# Patient Record
Sex: Female | Born: 1985 | Marital: Married | Smoking: Former smoker
Health system: Southern US, Community
[De-identification: ages and names within clinical notes are randomized; demographics above are authoritative.]

## PROBLEM LIST (undated history)

## (undated) DIAGNOSIS — I609 Nontraumatic subarachnoid hemorrhage, unspecified: Secondary | ICD-10-CM

## (undated) DIAGNOSIS — F419 Anxiety disorder, unspecified: Secondary | ICD-10-CM

## (undated) DIAGNOSIS — I671 Cerebral aneurysm, nonruptured: Secondary | ICD-10-CM

## (undated) DIAGNOSIS — F32A Depression, unspecified: Secondary | ICD-10-CM

## (undated) HISTORY — DX: Cerebral aneurysm, nonruptured: I67.1

---
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
## 2003-01-09 ENCOUNTER — Ambulatory Visit (HOSPITAL_COMMUNITY): Admission: RE | Admit: 2003-01-09 | Discharge: 2003-01-09 | Payer: Self-pay | Admitting: Obstetrics and Gynecology

## 2003-06-14 ENCOUNTER — Ambulatory Visit (HOSPITAL_COMMUNITY): Admission: AD | Admit: 2003-06-14 | Discharge: 2003-06-14 | Payer: Self-pay | Admitting: Obstetrics and Gynecology

## 2003-07-24 ENCOUNTER — Inpatient Hospital Stay (HOSPITAL_COMMUNITY): Admission: AD | Admit: 2003-07-24 | Discharge: 2003-07-27 | Payer: Self-pay | Admitting: Obstetrics and Gynecology

## 2004-01-08 ENCOUNTER — Ambulatory Visit (HOSPITAL_COMMUNITY): Admission: RE | Admit: 2004-01-08 | Discharge: 2004-01-08 | Payer: Self-pay | Admitting: *Deleted

## 2004-01-11 ENCOUNTER — Ambulatory Visit (HOSPITAL_COMMUNITY): Admission: RE | Admit: 2004-01-11 | Discharge: 2004-01-11 | Payer: Self-pay | Admitting: *Deleted

## 2004-05-06 ENCOUNTER — Ambulatory Visit: Payer: Self-pay | Admitting: Family Medicine

## 2004-09-02 ENCOUNTER — Ambulatory Visit: Payer: Self-pay | Admitting: Family Medicine

## 2004-09-08 ENCOUNTER — Ambulatory Visit (HOSPITAL_COMMUNITY): Admission: RE | Admit: 2004-09-08 | Discharge: 2004-09-08 | Payer: Self-pay | Admitting: Family Medicine

## 2004-10-10 ENCOUNTER — Ambulatory Visit: Payer: Self-pay | Admitting: Family Medicine

## 2004-10-15 ENCOUNTER — Ambulatory Visit: Payer: Self-pay | Admitting: Family Medicine

## 2004-10-20 ENCOUNTER — Ambulatory Visit: Payer: Self-pay | Admitting: Family Medicine

## 2004-11-27 ENCOUNTER — Ambulatory Visit: Payer: Self-pay | Admitting: Family Medicine

## 2004-12-29 ENCOUNTER — Ambulatory Visit: Payer: Self-pay | Admitting: Family Medicine

## 2005-01-05 ENCOUNTER — Ambulatory Visit: Payer: Self-pay | Admitting: Family Medicine

## 2005-02-11 ENCOUNTER — Ambulatory Visit: Payer: Self-pay | Admitting: Family Medicine

## 2005-02-24 ENCOUNTER — Ambulatory Visit: Payer: Self-pay | Admitting: Family Medicine

## 2005-03-19 ENCOUNTER — Ambulatory Visit: Payer: Self-pay | Admitting: Family Medicine

## 2005-05-13 ENCOUNTER — Ambulatory Visit: Payer: Self-pay | Admitting: Family Medicine

## 2005-05-26 ENCOUNTER — Ambulatory Visit (HOSPITAL_COMMUNITY): Admission: RE | Admit: 2005-05-26 | Discharge: 2005-05-26 | Payer: Self-pay | Admitting: Family Medicine

## 2005-05-26 ENCOUNTER — Ambulatory Visit: Payer: Self-pay | Admitting: Family Medicine

## 2005-06-18 ENCOUNTER — Ambulatory Visit: Payer: Self-pay | Admitting: Family Medicine

## 2005-07-16 ENCOUNTER — Ambulatory Visit: Payer: Self-pay | Admitting: Family Medicine

## 2005-07-21 ENCOUNTER — Ambulatory Visit: Payer: Self-pay | Admitting: Family Medicine

## 2005-08-03 ENCOUNTER — Ambulatory Visit: Payer: Self-pay | Admitting: Family Medicine

## 2005-09-22 ENCOUNTER — Ambulatory Visit: Payer: Self-pay | Admitting: Family Medicine

## 2005-09-23 ENCOUNTER — Ambulatory Visit (HOSPITAL_COMMUNITY): Admission: RE | Admit: 2005-09-23 | Discharge: 2005-09-23 | Payer: Self-pay | Admitting: Family Medicine

## 2006-01-26 ENCOUNTER — Ambulatory Visit: Payer: Self-pay | Admitting: Family Medicine

## 2006-02-15 ENCOUNTER — Ambulatory Visit: Payer: Self-pay | Admitting: Orthopedic Surgery

## 2006-02-22 ENCOUNTER — Encounter (HOSPITAL_COMMUNITY): Admission: RE | Admit: 2006-02-22 | Discharge: 2006-03-06 | Payer: Self-pay | Admitting: Orthopedic Surgery

## 2006-07-01 ENCOUNTER — Ambulatory Visit: Payer: Self-pay | Admitting: Family Medicine

## 2006-07-01 ENCOUNTER — Other Ambulatory Visit: Admission: RE | Admit: 2006-07-01 | Discharge: 2006-07-01 | Payer: Self-pay | Admitting: Family Medicine

## 2006-07-01 ENCOUNTER — Encounter: Payer: Self-pay | Admitting: Family Medicine

## 2006-07-02 ENCOUNTER — Encounter: Payer: Self-pay | Admitting: Family Medicine

## 2006-07-02 LAB — CONVERTED CEMR LAB: Candida species: NEGATIVE

## 2006-07-15 ENCOUNTER — Ambulatory Visit: Payer: Self-pay | Admitting: Family Medicine

## 2006-07-28 ENCOUNTER — Ambulatory Visit: Payer: Self-pay | Admitting: Family Medicine

## 2006-07-28 ENCOUNTER — Ambulatory Visit (HOSPITAL_COMMUNITY): Admission: RE | Admit: 2006-07-28 | Discharge: 2006-07-28 | Payer: Self-pay | Admitting: Family Medicine

## 2006-07-29 ENCOUNTER — Encounter: Payer: Self-pay | Admitting: Family Medicine

## 2006-10-29 ENCOUNTER — Ambulatory Visit: Payer: Self-pay | Admitting: Family Medicine

## 2007-02-01 ENCOUNTER — Ambulatory Visit: Payer: Self-pay | Admitting: Family Medicine

## 2007-02-25 ENCOUNTER — Ambulatory Visit: Payer: Self-pay | Admitting: Family Medicine

## 2007-05-09 ENCOUNTER — Ambulatory Visit: Payer: Self-pay | Admitting: Family Medicine

## 2007-05-09 ENCOUNTER — Ambulatory Visit (HOSPITAL_COMMUNITY): Admission: RE | Admit: 2007-05-09 | Discharge: 2007-05-09 | Payer: Self-pay | Admitting: Family Medicine

## 2007-05-10 ENCOUNTER — Encounter: Payer: Self-pay | Admitting: Family Medicine

## 2007-06-09 ENCOUNTER — Encounter: Payer: Self-pay | Admitting: Family Medicine

## 2007-06-22 ENCOUNTER — Other Ambulatory Visit: Admission: RE | Admit: 2007-06-22 | Discharge: 2007-06-22 | Payer: Self-pay | Admitting: Obstetrics & Gynecology

## 2007-11-09 ENCOUNTER — Ambulatory Visit: Payer: Self-pay | Admitting: Obstetrics & Gynecology

## 2007-11-09 ENCOUNTER — Inpatient Hospital Stay (HOSPITAL_COMMUNITY): Admission: AD | Admit: 2007-11-09 | Discharge: 2007-11-14 | Payer: Self-pay | Admitting: Gynecology

## 2007-11-24 ENCOUNTER — Ambulatory Visit (HOSPITAL_COMMUNITY): Admission: RE | Admit: 2007-11-24 | Discharge: 2007-11-24 | Payer: Self-pay | Admitting: Obstetrics and Gynecology

## 2007-12-05 ENCOUNTER — Inpatient Hospital Stay (HOSPITAL_COMMUNITY): Admission: AD | Admit: 2007-12-05 | Discharge: 2007-12-09 | Payer: Self-pay | Admitting: Obstetrics & Gynecology

## 2007-12-05 ENCOUNTER — Encounter: Payer: Self-pay | Admitting: Obstetrics & Gynecology

## 2007-12-05 ENCOUNTER — Ambulatory Visit: Payer: Self-pay | Admitting: Obstetrics & Gynecology

## 2007-12-05 HISTORY — PX: TUBAL LIGATION: SHX77

## 2008-09-16 ENCOUNTER — Encounter: Payer: Self-pay | Admitting: Family Medicine

## 2008-09-17 ENCOUNTER — Encounter: Payer: Self-pay | Admitting: Family Medicine

## 2008-09-17 ENCOUNTER — Telehealth: Payer: Self-pay | Admitting: Family Medicine

## 2008-10-16 ENCOUNTER — Ambulatory Visit: Payer: Self-pay | Admitting: Family Medicine

## 2008-10-16 DIAGNOSIS — F329 Major depressive disorder, single episode, unspecified: Secondary | ICD-10-CM

## 2008-10-16 DIAGNOSIS — M25519 Pain in unspecified shoulder: Secondary | ICD-10-CM

## 2008-10-16 DIAGNOSIS — J301 Allergic rhinitis due to pollen: Secondary | ICD-10-CM | POA: Insufficient documentation

## 2008-10-16 DIAGNOSIS — N946 Dysmenorrhea, unspecified: Secondary | ICD-10-CM

## 2008-10-16 DIAGNOSIS — F4321 Adjustment disorder with depressed mood: Secondary | ICD-10-CM

## 2008-10-16 DIAGNOSIS — N920 Excessive and frequent menstruation with regular cycle: Secondary | ICD-10-CM

## 2008-10-16 DIAGNOSIS — R55 Syncope and collapse: Secondary | ICD-10-CM | POA: Insufficient documentation

## 2008-10-16 DIAGNOSIS — F3289 Other specified depressive episodes: Secondary | ICD-10-CM | POA: Insufficient documentation

## 2008-10-16 DIAGNOSIS — D649 Anemia, unspecified: Secondary | ICD-10-CM

## 2008-10-18 DIAGNOSIS — G4700 Insomnia, unspecified: Secondary | ICD-10-CM | POA: Insufficient documentation

## 2008-10-18 DIAGNOSIS — L0100 Impetigo, unspecified: Secondary | ICD-10-CM | POA: Insufficient documentation

## 2008-10-18 DIAGNOSIS — N3000 Acute cystitis without hematuria: Secondary | ICD-10-CM

## 2008-11-20 ENCOUNTER — Encounter: Payer: Self-pay | Admitting: Family Medicine

## 2008-11-20 ENCOUNTER — Other Ambulatory Visit: Admission: RE | Admit: 2008-11-20 | Discharge: 2008-11-20 | Payer: Self-pay | Admitting: Family Medicine

## 2008-11-20 ENCOUNTER — Ambulatory Visit: Payer: Self-pay | Admitting: Family Medicine

## 2008-11-20 DIAGNOSIS — L708 Other acne: Secondary | ICD-10-CM

## 2008-11-20 DIAGNOSIS — N771 Vaginitis, vulvitis and vulvovaginitis in diseases classified elsewhere: Secondary | ICD-10-CM

## 2008-11-21 ENCOUNTER — Encounter: Payer: Self-pay | Admitting: Family Medicine

## 2008-11-21 LAB — CONVERTED CEMR LAB
Candida species: NEGATIVE
Chlamydia, DNA Probe: NEGATIVE
GC Probe Amp, Genital: NEGATIVE
Gardnerella vaginalis: NEGATIVE

## 2008-11-26 ENCOUNTER — Telehealth: Payer: Self-pay | Admitting: Family Medicine

## 2009-01-29 ENCOUNTER — Telehealth: Payer: Self-pay | Admitting: Family Medicine

## 2009-02-27 ENCOUNTER — Ambulatory Visit (HOSPITAL_COMMUNITY): Admission: RE | Admit: 2009-02-27 | Discharge: 2009-02-27 | Payer: Self-pay | Admitting: Obstetrics and Gynecology

## 2009-04-03 IMAGING — US US FETAL BPP W/O NONSTRESS
1 series · 14 of 28 positions shown · non-contrast
Comparison: none

OBSTETRICAL ULTRASOUND:

 This ultrasound exam was performed in the [HOSPITAL] Ultrasound Department.  The OB US report was generated in the AS system, and faxed to the ordering physician.  This report is also available in [REDACTED] PACS.

[Series 1: us fetal bpp w/o nonstress · 58 acquisitions, 14 frames shown]
[im 3/58]
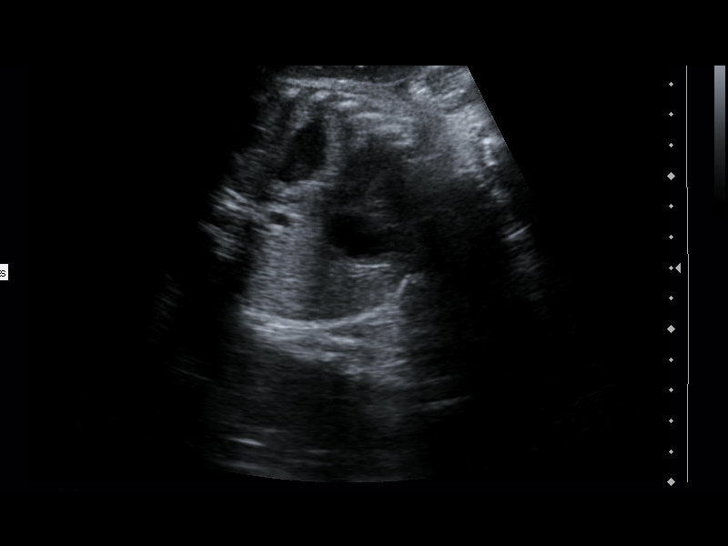
[im 7/58]
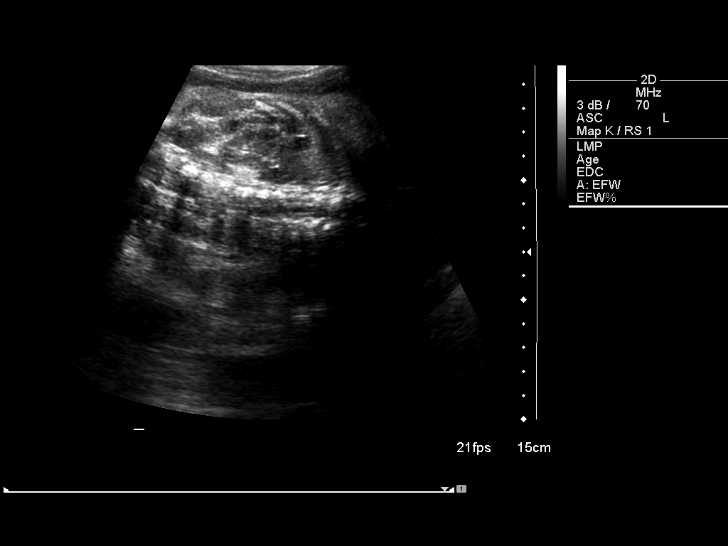
[im 11/58]
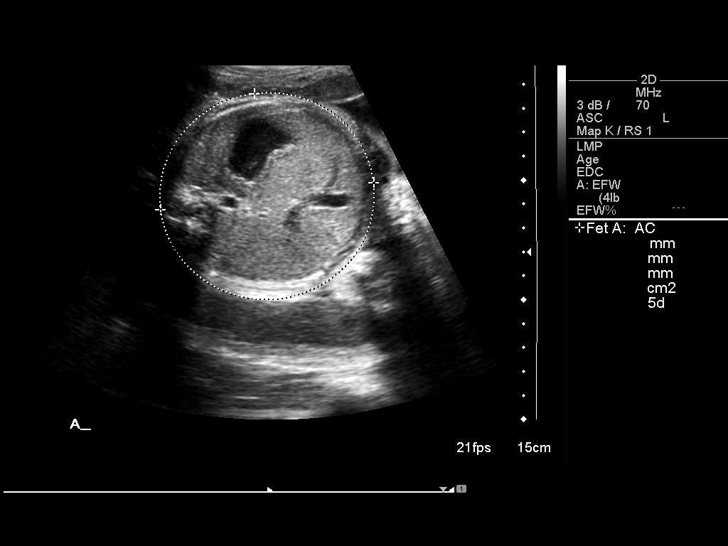
[im 15/58]
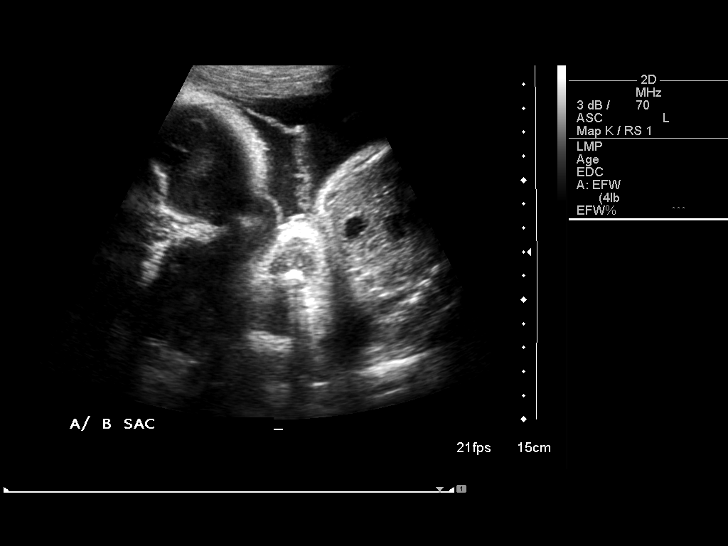
[im 20/58]
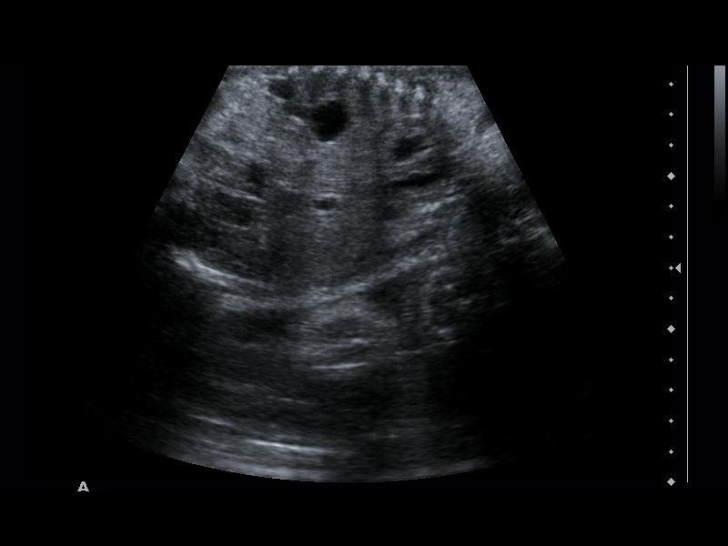
[im 24/58]
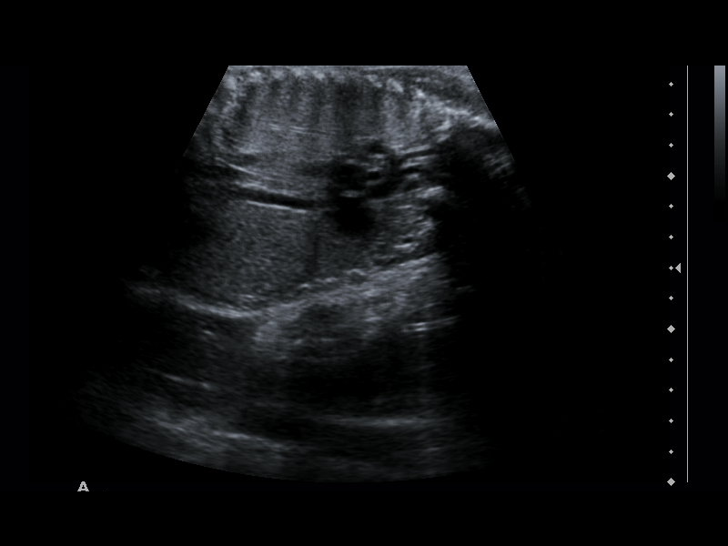
[im 28/58]
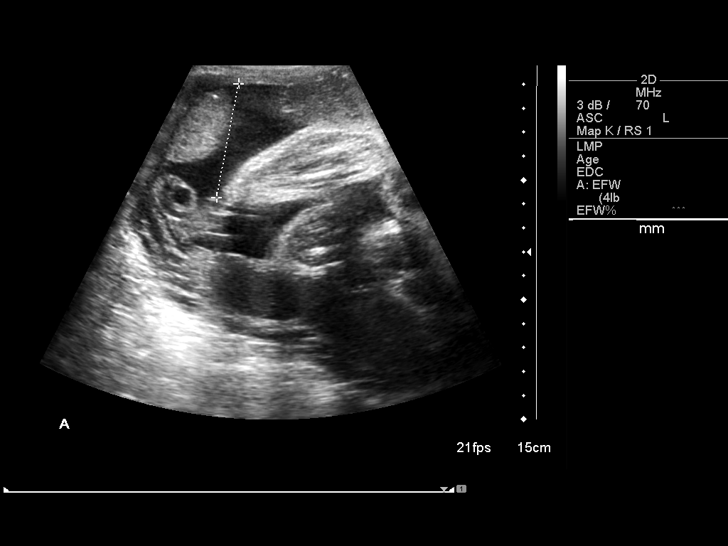
[im 32/58]
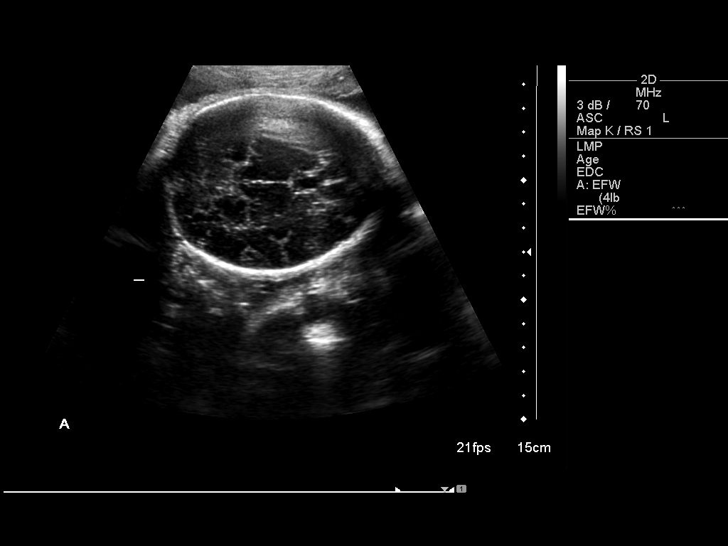
[im 36/58]
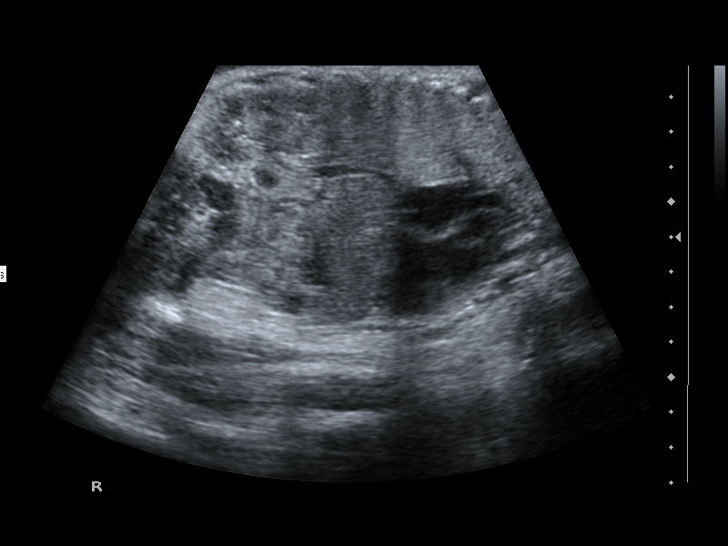
[im 41/58]
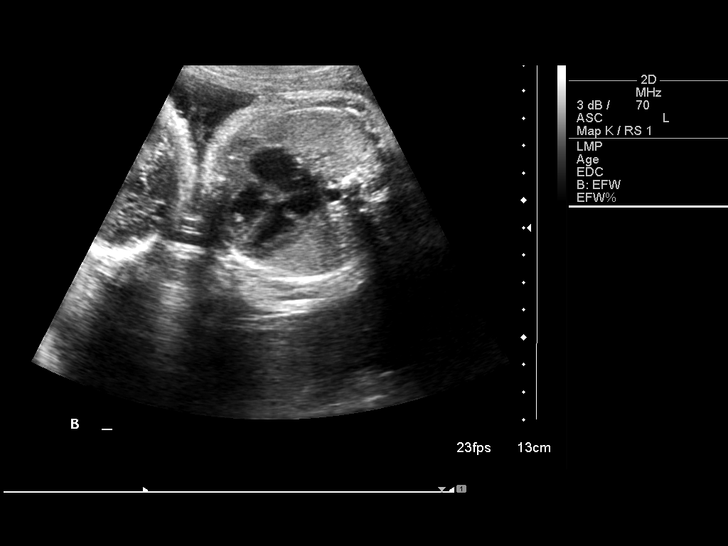
[im 45/58]
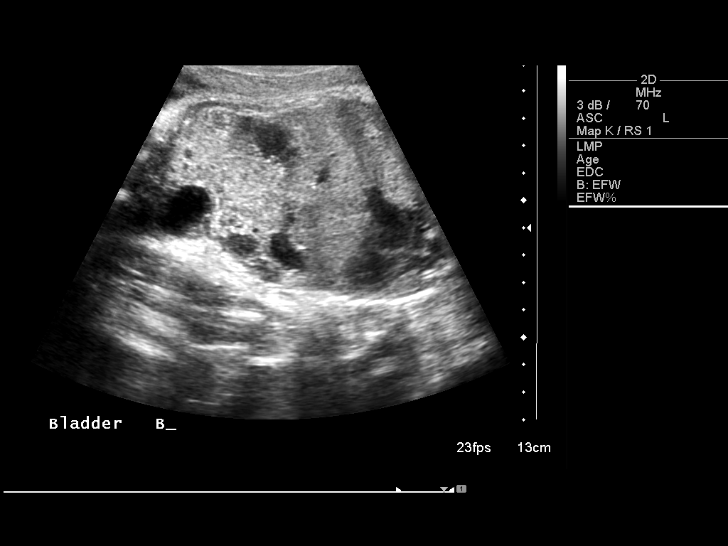
[im 49/58]
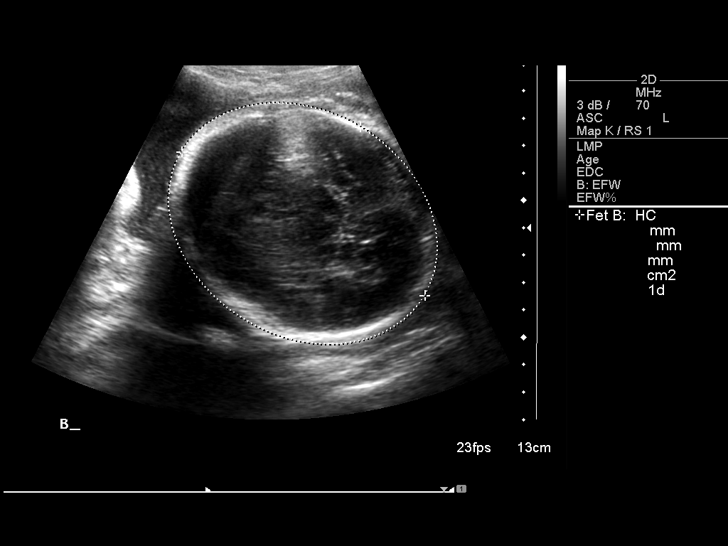
[im 53/58]
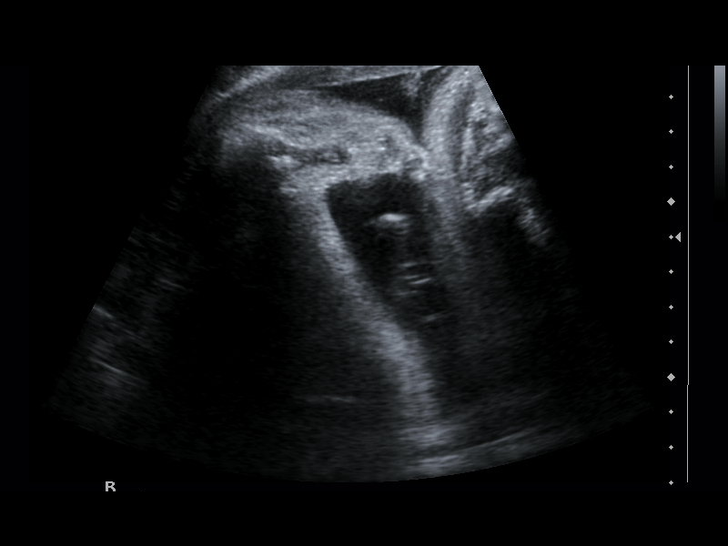
[im 58/58]
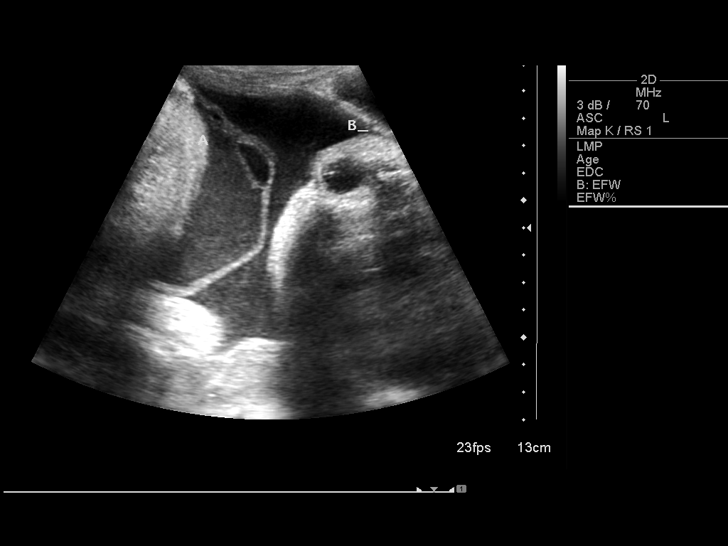

[14 of 28 positions shown; findings below may reference images not displayed]

IMPRESSION: See AS Obstetric US report.

## 2009-05-06 ENCOUNTER — Ambulatory Visit: Payer: Self-pay | Admitting: Family Medicine

## 2009-05-06 DIAGNOSIS — M549 Dorsalgia, unspecified: Secondary | ICD-10-CM | POA: Insufficient documentation

## 2009-05-20 ENCOUNTER — Encounter (INDEPENDENT_AMBULATORY_CARE_PROVIDER_SITE_OTHER): Payer: Self-pay | Admitting: *Deleted

## 2009-08-08 ENCOUNTER — Telehealth: Payer: Self-pay | Admitting: Family Medicine

## 2009-08-23 ENCOUNTER — Telehealth: Payer: Self-pay | Admitting: Family Medicine

## 2010-04-18 ENCOUNTER — Telehealth: Payer: Self-pay | Admitting: Family Medicine

## 2010-05-15 ENCOUNTER — Ambulatory Visit: Payer: Self-pay | Admitting: Family Medicine

## 2010-05-15 ENCOUNTER — Other Ambulatory Visit
Admission: RE | Admit: 2010-05-15 | Discharge: 2010-05-15 | Payer: Self-pay | Source: Home / Self Care | Admitting: Family Medicine

## 2010-05-15 DIAGNOSIS — R109 Unspecified abdominal pain: Secondary | ICD-10-CM | POA: Insufficient documentation

## 2010-05-16 ENCOUNTER — Encounter: Payer: Self-pay | Admitting: Family Medicine

## 2010-05-16 LAB — CONVERTED CEMR LAB: Candida species: NEGATIVE

## 2010-05-19 ENCOUNTER — Encounter: Payer: Self-pay | Admitting: Family Medicine

## 2010-05-19 ENCOUNTER — Telehealth: Payer: Self-pay | Admitting: Family Medicine

## 2010-05-21 ENCOUNTER — Encounter: Payer: Self-pay | Admitting: Family Medicine

## 2010-06-10 ENCOUNTER — Ambulatory Visit (HOSPITAL_COMMUNITY): Admission: RE | Admit: 2010-06-10 | Payer: Self-pay | Source: Home / Self Care | Admitting: Family Medicine

## 2010-07-06 LAB — CONVERTED CEMR LAB
Bilirubin Urine: NEGATIVE
Blood in Urine, dipstick: NEGATIVE
Nitrite: NEGATIVE
Urobilinogen, UA: 0.2

## 2010-07-08 NOTE — Progress Notes (Signed)
  Phone Note From Pharmacy   Caller: [facility]  [address] [id number] Summary of Call: singulair 10mg  requires pa would you like to substitute? Initial call taken by: Adella Hare LPN,  August 08, 2009 9:59 AM  Follow-up for Phone Call        Tell pt ins wont cover Singulair at this time.  I sent Rx for Allegra to the pharmacy. Follow-up by: Esperanza Sheets PA,  August 08, 2009 5:33 PM  Additional Follow-up for Phone Call Additional follow up Details #1::        called patient, unavailable Additional Follow-up by: Adella Hare LPN,  August 09, 2009 1:49 PM    Additional Follow-up for Phone Call Additional follow up Details #2::    patient aware Follow-up by: Adella Hare LPN,  August 12, 2009 8:42 AM  New/Updated Medications: FEXOFENADINE HCL 180 MG TABS (FEXOFENADINE HCL) 1 daily for allergies Prescriptions: FEXOFENADINE HCL 180 MG TABS (FEXOFENADINE HCL) 1 daily for allergies  #30 x 5   Entered and Authorized by:   Esperanza Sheets PA   Signed by:   Esperanza Sheets PA on 08/08/2009   Method used:   [facility]        [facility]  [address] [id number]       [address]       [place], [place]  [address]       Ph: [phone] or [phone]       Fax: [phone]   RxID:   [id number]

## 2010-07-08 NOTE — Progress Notes (Signed)
  Phone Note Call from Patient   Caller: Patient Summary of Call: patient states she has twins at home and cannot come in for appt no fever, no chills, or body aches has chest congestion advised mucinex otc if develops fever, chills needs to go to urgent care or er Initial call taken by: Adella Hare LPN,  April 18, 2010 11:56 AM

## 2010-07-08 NOTE — Letter (Signed)
Summary: external others  external others   Imported By: Curtis Sites 01/28/2010 08:31:48  _____________________________________________________________________  External Attachment:    Type:   Image     Comment:   External Document

## 2010-07-08 NOTE — Progress Notes (Signed)
Summary: insurance want pay for meds  Phone Note Call from Patient   Summary of Call: pt states medicaid want pay for allerga neither. [phone] Initial call taken by: Rudene Anda,  August 23, 2009 11:37 AM  Follow-up for Phone Call        See if the pharmacist knows if Medicaid is paying for any prescription antihistamines.   Follow-up by: Esperanza Sheets PA,  August 23, 2009 2:38 PM  Additional Follow-up for Phone Call Additional follow up Details #1::        Pharmacist said she didn't know of any antihistamine covered since alot were OTC now.  Additional Follow-up by: Everitt Amber LPN,  August 26, 2009 3:16 PM    Additional Follow-up for Phone Call Additional follow up Details #2::    Tell pt her ins wont pay for allergy meds. I saw Loratadine (generic of Claritin) at [facility] for less than $4 this wk for a 30 day supply. Follow-up by: Esperanza Sheets PA,  August 26, 2009 4:58 PM  Additional Follow-up for Phone Call Additional follow up Details #3:: Details for Additional Follow-up Action Taken: Patient aware and wanted the Loratadine [facility] for 30 day supply Additional Follow-up by: Everitt Amber LPN,  August 27, 2009 9:02 AM  New/Updated Medications: LORATADINE 10 MG TABS (LORATADINE) Take 1 tablet by mouth once a day Prescriptions: LORATADINE 10 MG TABS (LORATADINE) Take 1 tablet by mouth once a day  #30 x 1   Entered by:   Everitt Amber LPN   Authorized by:   Syliva Overman MD   Signed by:   Everitt Amber LPN on 09/81/1914   Method used:   Electronically to        [facility] (retail)       [address]       [place], [place]  [address]       Ph: [phone]       Fax: [phone]   RxID:   [id number]

## 2010-07-09 ENCOUNTER — Encounter: Payer: Self-pay | Admitting: Family Medicine

## 2010-07-10 NOTE — Letter (Signed)
Summary: medical release  medical release   Imported By: Lind Guest 05/15/2010 16:52:03  _____________________________________________________________________  External Attachment:    Type:   Image     Comment:   External Document

## 2010-07-10 NOTE — Progress Notes (Signed)
Summary: [facility], DID NOT RECIVE RX  Phone Note Call from Patient   Summary of Call: THE [facility] DID NOT RECIEVE HER MEDICINE FOR HER IBUO. AND BENA Initial call taken by: Lind Guest,  May 19, 2010 1:58 PM    Prescriptions: BENZACLIN 1-5 % GEL (CLINDAMYCIN PHOS-BENZOYL PEROX) apply at bedtimeto the face  #6 ounces x 3   Entered by:   Adella Hare LPN   Authorized by:   Syliva Overman MD   Signed by:   Adella Hare LPN on 16/03/9603   Method used:   [facility]        [facility]  [address] [id number]       [address]       [place], [place]  [address]       Ph: [phone] or [phone]       Fax: [phone]   RxID:   [id number] IBUPROFEN 800 MG TABS (IBUPROFEN) Take 1 tablet by mouth three times a day  #30 x 3   Entered by:   Adella Hare LPN   Authorized by:   Syliva Overman MD   Signed by:   Adella Hare LPN on 40/34/7425   Method used:   [facility]        [facility]  [address] [facility]       [address]       [place], [place]  [address]       Ph: [phone] or [phone]       Fax: [phone]   RxID:   [id number]

## 2010-07-10 NOTE — Assessment & Plan Note (Signed)
Summary: physical   Vital Signs:  Patient profile:   25 year old female Menstrual status:  regular Height:      62 inches Weight:      113.75 pounds BMI:     20.88 O2 Sat:      99 % on Room air Pulse rate:   70 / minute Pulse rhythm:   regular Resp:     16 per minute BP sitting:   120 / 70  (left arm)  Vitals Entered By: Adella Hare LPN (May 15, 2010 3:23 PM)  O2 Flow:  Room air CC: physical Is Patient Diabetic? No Pain Assessment Patient in pain? no        Primary Care Victorhugo Preis:  Syliva Overman MD  CC:  physical.  History of Present Illness: Reports  that she has been doing fairly well. She is doing well in school , and reports a good home life.  Denies recent fever or chills. Denies sinus pressure, nasal congestion , ear pain or sore throat. Denies chest congestion, or cough productive of sputum. Denies chest pain, palpitations, PND, orthopnea or leg swelling. Denies abdominal pain, nausea, vomitting, diarrhea or constipation. Denies change in bowel movements or bloody stool. Denies dysuria , frequency, incontinence or hesitancy. Denies  joint pain, swelling, or reduced mobility. Denies headaches, vertigo, seizures.  Denies  rash, lesions, or itch.     Current Medications (verified): 1)  Benzaclin 1-5 % Gel (Clindamycin Phos-Benzoyl Perox) .... Apply At [facility] The Face 2)  Ibuprofen 800 Mg Tabs (Ibuprofen) .... Take 1 Tablet By Mouth Three Times A Day  Allergies (verified): 1)  ! Ultram  Social History: Married Former Smoker  quit March 2010, this was a relapse Alcohol use-yes twice weekly on avg (tequilla) three daughters Currently in school  Review of Systems      See HPI General:  Complains of fatigue. Eyes:  Denies blurring, discharge, and eye pain. GU:  Complains of discharge; right pelvic pain for 3 years, ever since the twins, has been told she has ovaian cysts, and may eventually need a procedure, reports pain radiating to low  back now . Psych:  Complains of depression and easily tearful; denies anxiety, mental problems, suicidal thoughts/plans, thoughts of violence, and unusual visions or sounds; mild depression, requsts meds, unable to go to therapy. Endo:  Denies cold intolerance, excessive thirst, excessive urination, and heat intolerance. Heme:  Denies abnormal bruising and bleeding. Allergy:  Denies hives or rash and itching eyes.  Physical Exam  General:  Well-developed,well-nourished,in no acute distress; alert,appropriate and cooperative throughout examination Head:  Normocephalic and atraumatic without obvious abnormalities. No apparent alopecia or balding. Eyes:  No corneal or conjunctival inflammation noted. EOMI. Perrla. Funduscopic exam benign, without hemorrhages, exudates or papilledema. Vision grossly normal. Ears:  External ear exam shows no significant lesions or deformities.  Otoscopic examination reveals clear canals, tympanic membranes are intact bilaterally without bulging, retraction, inflammation or discharge. Hearing is grossly normal bilaterally. Nose:  External nasal examination shows no deformity or inflammation. Nasal mucosa are pink and moist without lesions or exudates. Mouth:  Oral mucosa and oropharynx without lesions or exudates.  Teeth in good repair. Neck:  No deformities, masses, or tenderness noted. Chest Wall:  No deformities, masses, or tenderness noted. Breasts:  No mass, nodules, thickening, tenderness, bulging, retraction, inflamation, nipple discharge or skin changes noted.   Lungs:  Normal respiratory effort, chest expands symmetrically. Lungs are clear to auscultation, no crackles or wheezes. Heart:  Normal rate  and regular rhythm. S1 and S2 normal without gallop, murmur, click, rub or other extra sounds. Abdomen:  Bowel sounds positive,abdomen soft and miold bilateral lower abdominal tenderness, no guarding or rebound, without masses, organomegaly or hernias  noted. Genitalia:  Normal introitus for age, no external lesions,fishy  vaginal discharge, mucosa pink and moist, no vaginal or cervical lesions, no vaginal atrophy, no friaility or hemorrhage, normal uterus size and position, no adnexal masses or tenderness Msk:  No deformity or scoliosis noted of thoracic or lumbar spine.   Pulses:  R and L carotid,radial,femoral,dorsalis pedis and posterior tibial pulses are full and equal bilaterally Extremities:  No clubbing, cyanosis, edema, or deformity noted with normal full range of motion of all joints.   Neurologic:  No cranial nerve deficits noted. Station and gait are normal. Plantar reflexes are down-going bilaterally. DTRs are symmetrical throughout. Sensory, motor and coordinative functions appear intact. Skin:  Intact without suspicious lesions or rashes Cervical Nodes:  No lymphadenopathy noted Axillary Nodes:  No palpable lymphadenopathy Inguinal Nodes:  No significant adenopathy Psych:  Cognition and judgment appear intact. Alert and cooperative with normal attention span and concentration. No apparent delusions, illusions, hallucinations   Impression & Recommendations:  Problem # 1:  PELVIC  PAIN (ICD-789.09) Assessment Deteriorated  Her updated medication list for this problem includes:    Ibuprofen 800 Mg Tabs (Ibuprofen) .Marland Kitchen... Take 1 tablet by mouth three times a day  Orders: Radiology Referral (Radiology)  Problem # 2:  DEPRESSION (ICD-311) Assessment: Deteriorated  Her updated medication list for this problem includes:    Fluoxetine Hcl 10 Mg Caps (Fluoxetine hcl) .Marland Kitchen... Take 1 capsule by mouth once a day  Complete Medication List: 1)  Benzaclin 1-5 % Gel (Clindamycin phos-benzoyl perox) .... Apply at bedtimeto the face 2)  Ibuprofen 800 Mg Tabs (Ibuprofen) .... Take 1 tablet by mouth three times a day 3)  Fluoxetine Hcl 10 Mg Caps (Fluoxetine hcl) .... Take 1 capsule by mouth once a day 4)  Metronidazole 500 Mg Tabs  (Metronidazole) .... Take 1 tablet by mouth two times a day  Other Orders: T-Wet Prep by Molecular Probe [id number]) T-Chlamydia & GC Probe, Genital ([id number]) T-Basic Metabolic Panel [id number]) T-Lipid Profile [id number]) T-CBC w/Diff [id number]) T-TSH [id number]) T-Vitamin D (25-Hydroxy) ([id number]) Pap Smear (03474)  Patient Instructions: 1)  Please schedule a follow-up appointment in 2.5 to 3 months. 2)  BMP prior to visit, ICD-9: 3)  Lipid Panel prior to visit, ICD-9: fasting labs  asap 4)  TSH prior to visit, ICD-9: 5)  CBC w/ Diff prior to visit, ICD-9: 6)  VitD 7)  New med as discussed. Prescriptions: METRONIDAZOLE 500 MG TABS (METRONIDAZOLE) Take 1 tablet by mouth two times a day  #14 x 0   Entered and Authorized by:   Syliva Overman MD   Signed by:   Syliva Overman MD on 05/15/2010   Method used:   [facility]        [facility]  [address] [facility]       [address]       [place], [place]  [address]       Ph: [phone] or [phone]       Fax: [phone]   RxID:   [id number] FLUOXETINE HCL 10 MG CAPS (FLUOXETINE HCL) Take 1 capsule by mouth once a day  #30 x 3   Entered and Authorized by:   Syliva Overman MD  Signed by:   Syliva Overman MD on 05/15/2010   Method used:   [facility]        [facility]  [address] [facility]       [address]       [place], [place]  [address]       Ph: [phone] or [phone]       Fax: [phone]   RxID:   [id number]    Orders Added: 1)  Est. Patient 18-39 years [99395] 2)  Radiology Referral [Radiology] 3)  T-Wet Prep by Molecular Probe [id number] 4)  T-Chlamydia & GC Probe, Genital [[id number]] 5)  T-Basic Metabolic Panel [id number] 6)  T-Lipid Profile [80061-22930] 7)  T-CBC w/Diff [33295-18841] 8)  T-TSH [66063-01601] 9)  T-Vitamin D (25-Hydroxy) [09323-55732] 10)  Pap Smear [20254]

## 2010-07-10 NOTE — Letter (Signed)
Summary: Pap Smear, Normal Letter, [facility]  [address]   [place], [place] [address]   Phone: [phone]  Fax: [phone]          May 21, 2010    Dear: [name]    I am pleased to notify you that your PAP smear was normal.  You will need your next PAP smear in:     ____ 3 Months    ____ 6 Months    ____ 12 Months    Please call the office at our office number above, to schedule your next appointment.    Sincerely,     [facility]

## 2010-07-10 NOTE — Letter (Signed)
[facility]   Imported By: Lind Guest 05/20/2010 14:29:20  _____________________________________________________________________  External Attachment:    Type:   Image     Comment:   External Document

## 2010-08-14 ENCOUNTER — Encounter: Payer: Self-pay | Admitting: Family Medicine

## 2010-08-14 ENCOUNTER — Ambulatory Visit: Payer: Self-pay | Admitting: Family Medicine

## 2010-10-21 NOTE — Op Note (Signed)
[name], [name]               ACCOUNT NO.:  [id number]   MEDICAL RECORD NO.:  [id number]          PATIENT TYPE:  INP   LOCATION:  9304                          [facility]   PHYSICIAN:  Norton Blizzard, MD    DATE OF BIRTH:  Sep 10, 1985   DATE OF PROCEDURE:  12/05/2007  DATE OF DISCHARGE:                               OPERATIVE REPORT   PREOPERATIVE DIAGNOSES:  1. Intrauterine pregnancy at 34-5/7th weeks' gestation.  2. Di-di twin gestation.  3. Preterm labor.  4. Twin B in noncephalic presentation.  5. Multiparity, desires sterilization.   POSTOPERATIVE DIAGNOSIS:  1. Intrauterine pregnancy at 34-5/7th weeks' gestation.  2. Di-di twin gestation.  3. Preterm labor.  4. Twin B in noncephalic presentation.  5. Multiparity, desires sterilization.   PROCEDURE:  Primary low transverse cesarean section and bilateral tubal  ligation using Filshie clips via Pfannenstiel incision.   SURGEON:  Norton Blizzard, MD.   ASSISTANT:  Zerita Boers, certified nurse midwife.   ANESTHESIA:  Spinal.   IV FLUIDS:  2 liters of lactated Ringer's.   ESTIMATED BLOOD LOSS:  700 mL.   URINE OUTPUT:  300 mL.   INDICATIONS:  The patient is a 25 year old G3, P1-0-1-1, with di-di twin  gestation at 66 5/7 weeks who presented in active preterm labor.  The  patient had been hospitalized earlier in June for preterm labor.  During  that admission, she received betamethasone, and was observed.  The  patient was sent home on bed rest.  She came back on December 05, 2007, in  labor and her cervical exam was 4 cm, 80, and -1, and the patient was  contracting every 2 to 3 minutes.  On a bedside ultrasound, the twins  were noted to be in a vertex transverse presentation. Given her active  labor, the patient was given the choice to try for a vaginal delivery  versus cesarean section; she was told that it is a possibility to do  a  version for the transverse twin and be able to deliver this twin  vaginally.  The patient opted for primary cesarean section; she also  desired bilateral tubal sterilization.  The risks of surgery including bleeding, infection, injury to  surrounding organs, need for additional procedures, risk of failure of  the sterilization of 5-7 in 1000 cases and increased risk of ectopic  gestation if pregnancy does occur were discussed with the patient, and  written informed consent was obtained.   FINDINGS:  Viable female infants in cephalic and transverse (head right)  positions.  Apgars of Twin A were 9 and 9, weight 4 pounds 3 ounces.  Apgars of Twin B was 9 and 9, weight 4 pounds 7 ounces.  There was clear  amniotic fluid x2 and had a spontaneous placental delivery, intact with  three-vessel cord x 2.  Normal uterus, fallopian tubes, and ovaries  bilaterally.   SPECIMENS:  Placenta.   DISPOSITION:  All specimens to pathology.   COMPLICATIONS:  None.   PROCEDURE DETAILS:  The patient received intravenous antibiotics prior  to the procedure.  She was  taken to the operating room where spinal  anesthesia was placed and found to be adequate.  She was placed in a  dorsal supine position with leftward tilt, and prepped and draped in a  sterile manner.  A Foley catheter was inserted into the patient's  bladder and attached to constant gravity.  Attention was then turned to  the patient's abdomen where a Pfannenstiel incision was made with a  scalpel and carried through to the underlying layer of fascia.  The  fascia was incised in the midline, and the incision was extended  bilaterally using Mayo scissors.  Kochers were then applied to the  superior aspect of this incision, and the rectus muscles were dissected  off bluntly and sharply.  A similar process was carried out on the  inferior aspect of the incision.  The rectus muscles were separated in  the midline bluntly and the peritoneum was entered bluntly.  This  incision was extended bilaterally in a blunt  and sharp fashion with good  visualization of the bowel and bladder.  Attention was then turned to  the lower segment where bladder flap was created using Metzenbaum  scissors, and a transverse hysterotomy was made using the scalpel.  This  hysterotomy was extended bilaterally in a blunt fashion.  Twin A was  encountered in the cephalic presentation and delivered after rupture of  the amniotic membranes.  The cord was clamped and cut, and Twin A was  handed over to the awaiting pediatricians.  At this point, Twin B was  noted to be transvere with her head on the right.  The head was brought  down towards the hysterotomy, the membranes were ruptured for clear  fluid, and Twin B was delivered in a cephalic presentation.  The cord  was also clamped and cut, and Twin B was handed over to the awaiting  pediatricians.  Fundal massage was then administered, and the placenta  delivered intact with three-vessel cord x2.  The uterus was then cleared  of all clot and debris.  The hysterotomy was repaired using 0 Monocryl  in a running locked fashion.  Overall, good hemostasis was noted.  Attention was then turned to bilateral fallopian tubes, where Filshie  clips were placed on the mid-isthmic region of bilateral tubes providing  total occlusion of both tubes.  Overall good hemostasis was noted.  The  pelvis and the gutters were then cleared of all clot and debris, and  hemostasis was reconfirmed on all surfaces.  The muscle and peritoneum  were reapproximated in mass fashion using two interrupted stitches of 0  Monocryl.  The fascia was then reapproximated using 0 Vicryl in a  running stitch, and the skin was closed with staples.  The patient  tolerated the procedure well.  Sponge, instrument, and needle counts  were correct x3.  She was taken to the recovery room awake and in stable  condition.      Norton Blizzard, MD  Electronically Signed     UAD/MEDQ  D:  12/05/2007  T:  12/06/2007   Job:  [id number]

## 2010-10-21 NOTE — Discharge Summary (Signed)
NAMEMARCE, [name] NO.:  [id number]   MEDICAL RECORD NO.:  [id number]          PATIENT TYPE:  INP   LOCATION:  9158                          [facility]   PHYSICIAN:  Tilda Burrow, M.D. DATE OF BIRTH:  07/15/1985   DATE OF ADMISSION:  11/11/2007  DATE OF DISCHARGE:  11/14/2007                               DISCHARGE SUMMARY   ADMITTING DIAGNOSES:  1. Pregnancy 31 plus weeks twin gestation.  2. Preterm labor.   DISCHARGE DIAGNOSES:  1. Pregnancy 32 weeks 1 day, result preterm labor, twin gestation with      symmetric growth.  2. 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   DISCHARGE MEDICATIONS:  1. Prenatal vitamins.  2. Iron daily.  3. Ambien 10 mg p.o. nightly p.r.n. sleep, dispensed 15.   Followup in 2 days [facility].   HOSPITAL SUMMARY:  This 25 year old gravida 3, para 1-0-1-1 with a twin  gestation, followed at [facility] since January 2009, initial  prenatal care with LMP April 05, 2007, placing [facility] January 14, 2008.  First ultrasound June 13, 2007, at 10 weeks' gestation showed [facility] of  January 07, 2008 at 2:30.  She was considered 32 weeks 1 day at this time.  She was admitted after presenting with complaints of contractions and  mucusy discharge for 3 days.  This is mild-to-moderate in intensity,  gradual character.  She was admitted for tocolytic therapy.  Cervix was  2 cm, 6%, -3.  Membranes intact when admitted.   HOSPITAL COURSE:  The patient was admitted, placed on magnesium sulfate  initially at 3 g bolus and 3 g per hour.  She had elevated magnesium  levels on the 3 g and was dropped then 2 g and 1 g per hour.  On November 10, 2007, she was begun on 1 g per hour.  On November 11, 2007, she was  discontinued magnesium sulfate.  She had by this time received  betamethasone x2 doses 12 mg IM every 24 hours.  She was started on  Procardia 30 mg twice daily.  She had some increased contractions and  was increased to 60  mg twice daily.  She had absolutely no contractions  on this pattern and was discontinued of all tocolytics on November 13, 2007.  During the hospitalization, she was diagnosed with group B strep  positive and received IV penicillin for this.  When the labor stopped  and had been stable without contractions until the morning of November 14, 2007, she was then discontinued of all tocolytics and the antibiotics.  Cervix had stabilized at 2 cm very long posterior.  Recheck of cervix on  November 14, 2007, showed the cervix to be 2 cm long posterior vertex and  external monitoring showed no contractions other than occasional once to  twice an hour mild _indentible__  contractions, felt to be [employer].  She was therefore discharged home on November 14, 2007, for followup  2 days later at [facility].   ADDENDUM  Ultrasound performed November 09, 2007, showed baby A to be  3 pounds 6 ounces  15.44 g, 30th percentile; and baby B to be 3 pounds 7 ounces, 31st  percentile with normal fetal survey on both infants.      Tilda Burrow, M.D.  Electronically Signed     JVF/MEDQ  D:  11/14/2007  T:  11/14/2007  Job:  [id number]

## 2010-10-21 NOTE — Discharge Summary (Signed)
[name], [name]               ACCOUNT NO.:  [id number]   MEDICAL RECORD NO.:  [id number]          PATIENT TYPE:  INP   LOCATION:  9304                          FACILITY:  [facility]   PHYSICIAN:  Norton Blizzard, MD    DATE OF BIRTH:  01/13/1986   DATE OF ADMISSION:  12/05/2007  DATE OF DISCHARGE:  12/09/2007                               DISCHARGE SUMMARY   DISCHARGE DIAGNOSES:  1. Primary low-transverse cesarean section and bilateral tubal      ligation.  2. Intrauterine pregnancy at 34 weeks 5 days gestation.  3. Dichorionic-diamniotic twin gestation.  4. Preterm labor.   PROCEDURE:  Primary low transverse cesarean section and BTL.   HOSPITAL COURSE:  Intrauterine twin pregnancy at approximately 34 weeks  and 5 days.  The patient is a 25 year old female G3, P 1-1-1-3 with twin  dichorionic-diamniotic gestation presented in preterm labor.  Twin B in  non-cephalic presentation.  Went to C-section with spinal anesthesia.  The patient delivered viable female infant baby A with Apgars 9 and 9  and baby B with Apgars 9 and 9 at 1 and 5 minutes.  There was clear  amniotic fluid.  Placenta x2 with spontaenous placental delivery.  Intact 3-vessel cords.  The patient had normal uterus, fallopian tubes,  and ovarian bilaterally.  Estimated blood loss was 700 mL.  Urine output  300 mL.  BTL was performed after extraction of placenta.  BTL was  accomplished with Filshie clips placed on mid-isthmic of the bilateral  tubes.  The patient claims to breastfeed; will pump until babies are  released from NICU.   Her admission labs are as follows.  Blood type A positive.  Syphilis  nonreactive.  HIV negative.  GC and Chlamydia negative.  Hepatitis B  surface antigen negative.  Rubella immune.  Discharge hemoglobin and  hematocrit, hemoglobin 9.0 and hematocrit 25.3 and this was on December 06, 2007.   DISCHARGE MEDICATIONS:  1. Percocet 5/325 mg 1-2 tabs p.o. q.4-6 h p.r.n. pain.  2. Ibuprofen 600  mg p.o. q.6 h p.r.n. pain.  3. Colace 100 mg p.o. b.i.d. p.r.n. constipation.  4. Prenatal vitamins daily.  5. Iron sulfate 325 mg p.o. b.i.d.   DISCHARGE INSTRUCTIONS:  The patient is to have pelvic rest x6 weeks.   FOLLOWUP:  The patient is to follow up at [facility] for routine  postpartum check.   Staples will be removed prior to the patient being discharged.      Milinda Antis, MD  Electronically Signed     ______________________________  Norton Blizzard, MD    KD/MEDQ  D:  12/09/2007  T:  12/09/2007  Job:  [id number]

## 2010-10-24 NOTE — Op Note (Signed)
[name], [name]                         ACCOUNT NO.:  [id number]   MEDICAL RECORD NO.:  [id number]                   PATIENT TYPE:  INP   LOCATION:  A403                                 FACILITY:  [facility]   PHYSICIAN:  Tilda Burrow, M.D.              DATE OF BIRTH:  Dec 14, 1985   DATE OF PROCEDURE:  DATE OF DISCHARGE:                                 OPERATIVE REPORT   DELIVERY TIME:  07/25/2003, 3:25 p.m.   PROCEDURE:  Vacuum-assisted vaginal delivery.   DESCRIPTION OF PROCEDURE:  The patient progressed through labor in a steady  fashion.  She was 7 cm at midday and functioning with an excellently  functioning epidural placed by Dr. Despina Hidden early in the morning.  She  progressed slowly to completely dilated shortly before 3 p.m., and after a  second stage of little over a half an hour was in an outlet position.  The  vertex was visible between the labia, at the introitus between contractions  without the patient pushing, but she was unable to bring the baby into  extension.  Decelerations of a frequent but reassuring pattern were present.  Vacuum assistance was offered to the patient, accepted, and utilized with  Foley catheter having been removed.  The patient pushed through two  contractions, bringing the baby into crowning position after which time the  Kiwi vacuum extractor was removed.  She was able to expel the baby the rest  of the way on her own, delivering a healthy 5 pound 11 ounce female infant  with Apgars of 9 and 9, delivered over an intact perineum.  There was a  nuchal cord x1 that was slipped over the head before delivery of the body.  The posterior shoulder was rotated anteriorly and delivered first.  The legs  were flexed in high leg support for delivery.  The patient showed excellent  interest in the baby, and delivery was attended by two support persons.  The  cord was clamped by me, cut by support person, and the infant was placed on  the maternal abdomen and  then on to the warmer.  Cord blood samples were  obtained, arterial and venous.  The placenta then delivered intact, Schultze  presentation.  There was greater than normal blood loss postdelivery, with  two episodes of heavy flow estimated at 600 cc total.  This responded to  uterine massage and IV oxytocin and was supplemented by Hemabate 125 mcg IM.  The patient then remained stable.  Her epidural catheter was removed with  the tip intact, and pulse remained less than 100.  Routine postpartum care  is anticipated.      ___________________________________________  Tilda Burrow, M.D.   JVF/MEDQ  D:  07/25/2003  T:  07/26/2003  Job:  [id number]

## 2010-10-24 NOTE — Op Note (Signed)
[name], ARAUZ                         ACCOUNT NO.:  [id number]   MEDICAL RECORD NO.:  [id number]                   PATIENT TYPE:  INP   LOCATION:  LDR2                                 FACILITY:  [facility]   PHYSICIAN:  Lazaro Arms, M.D.                DATE OF BIRTH:  02/09/1986   DATE OF PROCEDURE:  07/25/2003  DATE OF DISCHARGE:                                 OPERATIVE REPORT   PROCEDURE:  Epidural placement.   INDICATIONS FOR PROCEDURE:  [name] is a 25 year old African American  female, gravida 1, para 0 with an estimated date of delivery of 07/25/2003 at  40-weeks gestation today who was admitted last night for Foley bulb ripening  and Pitocin induction this morning for labor.  She is 4 cm.  She had a dose  of IV pain medicine and is requesting an epidural.   DESCRIPTION OF PROCEDURE:  The patient is placed in the sitting position.  Betadine prep is used.  Field drape.  L2-L3 interspace identified.  Lidocaine 1% is injected.  Local anesthetic is placed. A 17-gauge Tuohy  needle is used and a loss of resistance technique employed.  The epidural  space is found with one pass without difficulty.  Bupivacaine 0.125%, 10 cc,  is given as a test dose without ill effects.  An additional 10 cc is given  to dose up the epidural.  It is taped down 5 cm into the epidural space.  The continuous infusion of 0.125% bupivacaine and 2 mcg per cc of Fentanyl  is started at 12 cc an hour.  The patient is comfortable.  Fetal heart rate  is reassuring.  Blood pressure is stable.      ___________________________________________                                            Lazaro Arms, M.D.   LHE/MEDQ  D:  07/25/2003  T:  07/25/2003  Job:  [id number]

## 2010-10-24 NOTE — Procedures (Signed)
[name], [name]                         ACCOUNT NO.:  [id number]   MEDICAL RECORD NO.:  [id number]                   PATIENT TYPE:  OUT   LOCATION:  RAD                                  FACILITY:  [facility]   PHYSICIAN:  Vida Roller, M.D.                DATE OF BIRTH:  07/19/1985   DATE OF PROCEDURE:  01/11/2004  DATE OF DISCHARGE:                                  ECHOCARDIOGRAM   PRIMARY CARE PHYSICIAN:  Dr. Lodema Hong.   TAPE NUMBER:  [id number].   TAPE RECORDING:  0 through 361.   HISTORY:  A 25 year old female with syncope.  Previous echocardiogram in  August 2004, was essentially normal.   TECHNICAL QUALITY:  Today's study is good.   MO TRACINGS:  Aorta 25 mm.   Left atrium 35 mm.   Septum is 10 mm.   Posterior wall is 9 mm.   Left ventricular diastolic dimension is 47 mm.   Left ventricular systolic dimension is 31 mm.   2-D AND DOPPLER IMAGING:  The left ventricle is normal size with normal  systolic function.  No wall motion abnormalities are seen.   The right ventricle is normal size with normal systolic function.   Both atria is normal size.  There is no atrial septal defect.   The aortic valve is trileaflet, tricommisure, with no evidence of stenosis  or regurgitation.   The mitral valve is morphologically unremarkable with no stenosis or  regurgitation.   Tricuspid vale is morphologically unremarkable with trivial insufficiency,  no stenosis is seen.   Pulmonic valve is not well seen, but has no evidence of stenosis, trivial  insufficiency.   Pericardial structures are normal.   The inferior vena cava is normal.   Ascending aorta to the limit of the study is normal.      ___________________________________________                                            Vida Roller, M.D.   JH/MEDQ  D:  01/11/2004  T:  01/11/2004  Job:  [id number]

## 2010-10-24 NOTE — Discharge Summary (Signed)
NAMELYNE, KHURANA NO.:  [id number]   MEDICAL RECORD NO.:  [id number]                   PATIENT TYPE:  INP   LOCATION:  6735                                 FACILITY:  [facility]   PHYSICIAN:  Leighton Roach McDiarmid, M.D.             DATE OF BIRTH:  26-May-1986   DATE OF ADMISSION:  02/22/2002  DATE OF DISCHARGE:  02/25/2002                                 DISCHARGE SUMMARY   PRIMARY CARE PHYSICIAN:  The patient's primary care physician is Dr.  Syliva Overman of [facility].   DISCHARGE DIAGNOSES:  1. Pelvic inflammatory disease.  2. Gonococcal infection.  3. Bacterial vaginosis.   DISCHARGE MEDICATIONS:  1. Flagyl 500 mg p.o. b.i.d. through March 02, 2002.  2. Tequin 400 mg p.o. q.d. through March 09, 2002.  3. Tylenol p.r.n.   FOLLOW UP:  The patient is to call for an appointment with Dr. Lodema Hong in 1-  2 weeks or sooner if other concerns develop.   PROCEDURES AND DIAGNOSTIC STUDIES:  None.   CONSULTATIONS:  None.   ADMISSION HISTORY AND PHYSICAL:  Please see dictated H&P.   HOSPITAL COURSE:  1. GONOCOCCAL/PELVIC INFLAMMATORY DISEASE.  The patient was admitted with a     history of vaginal discharge and abdominal pain.  She was suspected to     have pelvic inflammatory disease on admission and was started on Ancef     and doxycycline.  She responded well to those medications and was changed     over to p.o. Tequin and continued to respond well.  She became afebrile     and without pain which lasted for greater than 24 hours and therefore,     the patient was ready for discharge.  The patient will need a total of 2     weeks of treatment and will continue on Tequin as this is a once-a-day     medication and she is likely to remember it better that way.  2. BACTERIAL VAGINOSIS.  The patient was found to have many clue cells on     initial wet prep.  We will treat with a total 7-day course of 500 mg of  p.o. Flagyl b.i.d.  The patient was instructed on the risks of douching     and discouraged from this behavior in the future.  3. HIGH-RISK SEXUAL BEHAVIORS.  The risks of unprotected intercourse were     discussed ad nauseum with the patient including HIV, HSV, HPV, and     others.  The patient voiced understanding with this as did her mother.     She seems reluctant to participate in such behaviors in the future.  She     likely would benefit from a followup HIV test in 6 months.    DISCHARGE LABORATORIES:  Repeat UA on February 24, 2002 was negative, a  urine culture is pending, HIV negative,  RPR pending, urine pregnancy  negative, GC positive, Chlamydia negative, wet prep no yeast, few  Trichomonas, moderate clue cells, too-numerous-too-count wbc's.  Urine  culture from February 23, 2002 negative.   DISPOSITION:  The patient was discharged to home without further incident.     Jonah Blue, M.D.                      Etta Grandchild, M.D.    Milas Gain  D:  02/25/2002  T:  02/28/2002  Job:  [id number]   cc:   Milus Mallick. Lodema Hong, M.D.  [address]  [place]  [place] [address]  Fax: [phone]

## 2010-10-24 NOTE — Procedures (Signed)
NAMECOLLETTA, SPILLERS                         ACCOUNT NO.:  [id number]   MEDICAL RECORD NO.:  [id number]                   PATIENT TYPE:  OUT   LOCATION:  RAD                                  FACILITY:  [facility]   PHYSICIAN:  Vida Roller, M.D.                DATE OF BIRTH:  02-09-1986   DATE OF PROCEDURE:  01/11/2004  DATE OF DISCHARGE:                                  ECHOCARDIOGRAM   PRIMARY CARE PHYSICIAN:  Dr. Lodema Hong.   Tape Number:  [id number].  Tape Count:  [id number].   This is a 25 year old female with syncope, previous echo has been dictated.   DETAILS OF THE PROCEDURE:  The patient was brought to the echocardiographic  lab where she was placed in the left lateral decubitus position.  Echocardiographic images were obtained over the apical four, apical two,  parasternal long, and parasternal short axis views.  The patient was then  moved to the stress lab where she underwent an upright stress test with  Bruce protocol.  When she had obtained at least 85% of her max predicted  heart rate for her age, she was moved back to the echocardiographic lab  where those images were repeated and used for comparison.  She then  recovered as appropriate for an exercise stress test.   STRESS TEST:  The patient exercised eight minutes of the Bruce protocol  obtaining 10 minutes of exercise.  Her heart rate increased from 93 beats a  minute to 179 beats a minute, which is 90% of her max predicted heart rate  for her age.  Her blood pressure went from 102/58 to 168/70.  During that  time she experienced no chest pain or shortness of breath.  Her reason for  stopping the test was fatigue.  She had a few PACs, but no ischemic ST-T  wave changes.   IMAGES:  Resting images show normal left ventricular size and function with  no obvious wall motion abnormalities.   EXERCISE:  Post exercise images show appropriate augmentation of systolic  function.  There are no exercise induced wall motion  abnormalities seen.   INTERPRETATION:  No evidence of significant ischemic cardiovascular disease.      ___________________________________________                                            Vida Roller, M.D.   JH/MEDQ  D:  01/11/2004  T:  01/11/2004  Job:  [id number]

## 2010-10-24 NOTE — Procedures (Signed)
   NAMEMarland Andersen  TRAN, RANDLE                         ACCOUNT NO.:  [id number]   MEDICAL RECORD NO.:  [id number]                   PATIENT TYPE:  OUT   LOCATION:  RAD                                  FACILITY:  [facility]   PHYSICIAN:  Vida Roller, M.D.                DATE OF BIRTH:  Aug 08, 1985   DATE OF PROCEDURE:  01/09/2003  DATE OF DISCHARGE:                                  ECHOCARDIOGRAM   TAPE NUMBER:  [id number]   TAPE COUNT:  [id number]   CLINICAL INFORMATION:  This is a 25 year old who is 2-1/2 months pregnant  with chest pain.   TECHNICAL QUALITY:  Limited.   M-MODE TRACINGS:  1. The aorta is 23 mm.  2. The left atrium is 37 mm.  3. The septum is 10 mm.  4. The left ventricular posterior wall is 9 mm.  5. The left ventricular diastolic dimension is 45 mm.  6. The left ventricular systolic dimension is 32 mm.   2-D AND DOPPLER IMAGING:  1. The left ventricle is normal size with normal systolic function.  Wall     motion assessment was limited due to the difficulty in obtaining adequate     views.  However, there appears to be no significant wall motion     abnormalities.  2. The right ventricle is normal size with normal systolic function.  3. Both atria are normal size with no evidence of an atrioseptal defect.  4. The aortic valve is trileaflet and tricommisural with no evidence of     stenosis or regurgitation.  5. The mitral valve is morphologically unremarkable with no stenosis or     regurgitation.  6. The tricuspid valve is morphologically unremarkable with trace-to-mild     insufficiency.  No stenosis is seen.  7. The pulmonic valve was not well seen, but appears to have at least trace     insufficiency.  8. The pericardial structures appear normal.  9. The ascending aorta appears normal.  10.      There is no evidence of enlarged inferior vena cava.                                               Vida Roller, M.D.    JH/MEDQ  D:  01/09/2003  T:  01/09/2003   Job:  [id number]

## 2010-10-24 NOTE — H&P (Signed)
NAMELANITA, STAMMEN NO.:  [id number]   MEDICAL RECORD NO.:  [id number]                   PATIENT TYPE:  INP   LOCATION:  1832                                 FACILITY:  [facility]   PHYSICIAN:  Leighton Roach McDiarmid, M.D.             DATE OF BIRTH:  04/03/1986   DATE OF ADMISSION:  02/22/2002  DATE OF DISCHARGE:                                HISTORY & PHYSICAL   SERVICE:  Conservation officer, historic buildings.   CHIEF COMPLAINT:  Abdominal pain.   HISTORY OF PRESENT ILLNESS:  This is an 25 year old African American who  presents with a one-week history of lower abdominal pain.  She reports being  on her menstrual cycle at the time last week and thought that her symptoms  were related to her cycle with menstrual cramping.  She does describe the  pain as crampy in nature but also reports that the crampiness did last after  her period ended, which is unusual for her.  Her period lasted five days and  was of normal flow and duration.  She denies any nausea, vomiting, diarrhea,  otherwise has felt well; she is slightly nauseated now though.  She reports  that she did not have any fever that she knew of up until today, when she  did have chills and was aching all over.  The pain in her abdomen became  more and more intense today, throughout the day, to where it was rated as a  9/10, necessitating her coming to the emergency room.  She also reports that  she has had some vaginal discharge over the past couple of days that has  been malodorous and yellow-colored.   PAST MEDICAL HISTORY:  None.   ALLERGIES:  None.   MEDICATIONS:  Ortho Tri-Cyclen.   FAMILY HISTORY:  Her maternal grandmother has hypertension.  Father has  diabetes, hypertension and coronary artery disease with first MI at the age  of 56.  Her sister has sickle cell trait.   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  She denies  any tobacco but does  drink occasional alcohol on the weekends.  Denies any IV drug use.  She does  not have a boyfriend currently but has been sexually active; three weeks ago  was her last intercourse; she did not use a condom at that time.  She has  had eight sexual partners over her lifetime, does usually try to wear  condoms though.   REVIEW OF SYSTEMS:  She denies any weight change.  HEENT:  She has had some  URI symptoms a week ago, otherwise, negative.  CARDIOVASCULAR:  No chest  pain but occasionally does have palpitations including today.  PULMONARY:  No shortness of breath.  No cough.  GI:  No constipation, diarrhea, bright  red blood per rectum, melenic stools.  GYN:  She has no previous sexually  transmitted disease history.  She denies any history of abnormal Pap smears.  She has had a discharge as described under HPI.  She does also admit to  douching on a regular basis.  SKIN:  She denies any rashes.   PHYSICAL EXAMINATION:  VITAL SIGNS:  Temperature 100.2 but spiked to 102.2  in the emergency room.  Blood pressure 113/56.  Pulse 109.  Respiratory rate  20.  SAO2 98% on room air.  GENERAL:  In general, she is a pleasant African American female in no acute  distress.  She is drowsy on exam.  HEENT:  Pupils are equal, round and reactive to light and accommodation.  Extraocular muscles are intact.  TMs are clear bilaterally.  Oropharynx is  clear with moist mucous membranes.  NECK:  Neck is supple without lymphadenopathy or thyromegaly.  CHEST:  Chest is clear to auscultation bilaterally.  HEART:  Regular rate and rhythm, although she is slightly tachycardic with a  2/6 flow murmur on exam.  ABDOMEN:  Abdomen is soft.  She has tenderness to palpation over bilateral  lower quadrants, especially in the suprapubic region and hypogastric region.  She did not have any rebound or guarding, positive normoactive bowel sounds.  She did have a Higher education careers adviser piercing.  EXTREMITIES:   Extremities show no clubbing, cyanosis, or edema.  Dorsalis  pedis pulses are 2+ bilaterally.  NEUROLOGIC:  She is alert and oriented x4.  Cranial nerves II-XII grossly  intact.  DTRs 2+ throughout.  Musculoskeletal strength 5/5 throughout,  nonfocal.   LABORATORY DATA:  White count 14.3, hemoglobin 12.3, platelet count 260,000;  89% segs, 6% lymphs, ANC is 12.7.  UA showed specific gravity of 1.023,  negative protein, negative nitrite, small leukocyte esterase.  Wet prep  showed no yeast, a few Trichomonas, moderate clue cells, too numerous to  count wbc's.  GC and Chlamydia cultures are pending.   ASSESSMENT AND PLAN:  This is an 25 year old African American female with no  significant past medical history, who presents with abdominal pain and  apparent pelvic inflammatory disease.   1. Abdominal pain likely secondary to pelvic inflammatory disease:  We will     go ahead and place her on intravenous antibiotics overnight to cover for     gonococcus and Chlamydia with intravenous Ancef and doxycycline.  She     seemed to be tolerating p.o.'s well up until now, so we probably can     change her back over to p.o.'s tomorrow.  We will repeat a complete blood     count in the morning to ensure white count is coming down.  We will also     use p.o. pain medications for now to control pain, but may need     intravenous medications if not adequately controlled.  2. Bacterial vaginosis:  We will treat with a five-day course of p.o.     Flagyl.  Did encourage the patient to not douche in the future as this     could predispose her to recurrent bacterial vaginosis.  3. Fluids, electrolytes, and nutrition:  She is slightly nauseated now, so     we will place her on maintenance intravenous fluids and clear liquids     overnight and advance her diet as tolerated tomorrow and discontinue her     intravenous fluids. 4. Sexual promiscuity:  I did advise the patient to always use condoms to      prevent  sexually transmitted disease.     Dalbert Mayotte, MD                         Leighton Roach McDiarmid, M.D.    AS/MEDQ  D:  02/22/2002  T:  02/24/2002  Job:  [id number]

## 2011-03-05 LAB — URINALYSIS, ROUTINE W REFLEX MICROSCOPIC
Bilirubin Urine: NEGATIVE
Bilirubin Urine: NEGATIVE
Hgb urine dipstick: NEGATIVE
Ketones, ur: NEGATIVE
Nitrite: NEGATIVE
Nitrite: NEGATIVE
Protein, ur: NEGATIVE
Specific Gravity, Urine: 1.01
pH: 6.5
pH: 8

## 2011-03-05 LAB — STREP B DNA PROBE

## 2011-03-05 LAB — TYPE AND SCREEN
ABO/RH(D): A NEG
DAT, IgG: NEGATIVE

## 2011-03-05 LAB — RH IMMUNE GLOB WKUP(>/=20WKS)(NOT WOMEN'S HOSP)

## 2011-03-05 LAB — RPR
RPR Ser Ql: NONREACTIVE
RPR Ser Ql: NONREACTIVE

## 2011-03-05 LAB — COMPREHENSIVE METABOLIC PANEL
ALT: 14
AST: 17
Alkaline Phosphatase: 99
CO2: 21
Calcium: 8.4
Chloride: 101
GFR calc Af Amer: >60
GFR calc non Af Amer: >60
Glucose, Bld: 100 — ABNORMAL HIGH
Sodium: 134 — ABNORMAL LOW
Total Bilirubin: 1.1

## 2011-03-05 LAB — CBC
HCT: 25.3 — ABNORMAL LOW
Hemoglobin: 8.7 — ABNORMAL LOW
Hemoglobin: 9 — ABNORMAL LOW
MCHC: 35.5
MCHC: 33.5
MCV: 110.8 — ABNORMAL HIGH
MCV: 111.5 — ABNORMAL HIGH
RBC: 2.23 — ABNORMAL LOW
RBC: 2.27 — ABNORMAL LOW
RBC: 2.71 — ABNORMAL LOW
RDW: 13.6
WBC: 7
WBC: 6.3

## 2011-03-05 LAB — KLEIHAUER-BETKE STAIN
Fetal Cells %: 0.1
Quantitation Fetal Hemoglobin: 5

## 2011-03-05 LAB — RAPID URINE DRUG SCREEN, HOSP PERFORMED
Benzodiazepines: NOT DETECTED
Cocaine: NOT DETECTED
Tetrahydrocannabinol: NOT DETECTED

## 2011-09-30 ENCOUNTER — Other Ambulatory Visit (HOSPITAL_COMMUNITY)
Admission: RE | Admit: 2011-09-30 | Discharge: 2011-09-30 | Disposition: A | Discharge status: Home / Self Care | Payer: Self-pay | Source: Ambulatory Visit | Attending: Obstetrics and Gynecology | Admitting: Obstetrics and Gynecology

## 2011-09-30 ENCOUNTER — Other Ambulatory Visit: Payer: Self-pay | Admitting: Obstetrics and Gynecology

## 2011-09-30 DIAGNOSIS — Z01419 Encounter for gynecological examination (general) (routine) without abnormal findings: Secondary | ICD-10-CM | POA: Insufficient documentation

## 2011-11-16 ENCOUNTER — Other Ambulatory Visit: Payer: Self-pay | Admitting: Family Medicine

## 2011-11-16 ENCOUNTER — Encounter: Payer: Self-pay | Admitting: Family Medicine

## 2011-11-16 ENCOUNTER — Ambulatory Visit (INDEPENDENT_AMBULATORY_CARE_PROVIDER_SITE_OTHER): Payer: Self-pay | Admitting: Family Medicine

## 2011-11-16 VITALS — BP 122/76 | HR 73 | Resp 18 | Ht 62.0 in | Wt 114.1 lb

## 2011-11-16 DIAGNOSIS — F329 Major depressive disorder, single episode, unspecified: Secondary | ICD-10-CM

## 2011-11-16 DIAGNOSIS — K5900 Constipation, unspecified: Secondary | ICD-10-CM

## 2011-11-16 MED ORDER — PAROXETINE HCL 10 MG PO TABS: 10.0000 mg | ORAL_TABLET | Freq: Every day | ORAL | Status: DC

## 2011-11-16 MED ORDER — ALPRAZOLAM 0.25 MG PO TABS: ORAL_TABLET | ORAL | Status: AC

## 2011-11-16 NOTE — Progress Notes (Signed)
  Subjective:    Patient ID: [name], female    DOB: 1985/07/04, 26 y.o.   MRN: [id number]  HPI Pt in for f/u referred from gyne due to c/o abdominal pain and constipation. Pt has Bm approx every 3 to 4 days , it is hard. She tends to hold onto her stool and not empty regularly, she can however easily identify foods which make her go regularly. States she is extremely depressed, and has been since childhood. Has self medicated with alcohol and pot, now she has to start work knows she needs to "get clean"She denies suicidal or homicidal thought   Review of Systems See HPI Denies recent fever or chills. Denies sinus pressure, nasal congestion, ear pain or sore throat. Denies chest congestion, productive cough or wheezing. Denies chest pains, palpitations and leg swelling   Denies dysuria, frequency, hesitancy or incontinence. Denies joint pain, swelling and limitation in mobility. Denies headaches, seizures, numbness, or tingling.  Denies skin break down or rash.        Objective:   Physical Exam Patient alert and oriented and in no cardiopulmonary distress.  HEENT: No facial asymmetry, EOMI, no sinus tenderness,  oropharynx pink and moist.  Neck supple no adenopathy.  Chest: Clear to auscultation bilaterally.  CVS: S1, S2 no murmurs, no S3.  ABD: Soft non tender. Bowel sounds normal.  Ext: No edema  MS: Adequate ROM spine, shoulders, hips and knees.  Skin: Intact, no ulcerations or rash noted.  Psych: Good eye contact, normal affect. Memory intact not anxious slightly depressed appearing and tearful at times  CNS: CN 2-12 intact, power, tone and sensation normal throughout.        Assessment & Plan:

## 2011-11-16 NOTE — Patient Instructions (Addendum)
F/u in 5 weeks  You will be started on paroxetine one daily for anxiety and depression and you will get a very low dose of xanax to take half to one tablet as needed for extrreme anxiety. Ten tablets to last for 6 weeks  You need to stop all other drugs and alcohol   You will be referred for therapy.  Use stool softener two daily and eat every day foods   that will help with connstipation eg collard greens.  Labs today, cbc, cmp, tsh

## 2011-11-17 LAB — CBC
MCV: 104.2 fL — ABNORMAL HIGH (ref 78.0–100.0)
Platelets: 178 10*3/uL (ref 150–400)
RDW: 13.7 % (ref 11.5–15.5)
WBC: 4.7 10*3/uL (ref 4.0–10.5)

## 2011-11-17 LAB — COMPREHENSIVE METABOLIC PANEL
ALT: 14 U/L (ref 0–35)
AST: 14 U/L (ref 0–37)
Calcium: 9.3 mg/dL (ref 8.4–10.5)
Chloride: 107 mEq/L (ref 96–112)
Creat: 0.75 mg/dL (ref 0.50–1.10)
Potassium: 4 mEq/L (ref 3.5–5.3)
Sodium: 142 mEq/L (ref 135–145)
Total Protein: 7.2 g/dL (ref 6.0–8.3)

## 2011-11-29 DIAGNOSIS — K5900 Constipation, unspecified: Secondary | ICD-10-CM | POA: Insufficient documentation

## 2011-11-29 NOTE — Assessment & Plan Note (Signed)
Deteriorated, not suicidal or homicidal, has beeen self medication for years on pot and alcohol, now needs to go to work and has to "get clean"

## 2011-11-29 NOTE — Assessment & Plan Note (Signed)
Counseled and encouraged on how to deal with this "naturally " through changes in diet and regular use of stool softeners

## 2012-01-25 ENCOUNTER — Telehealth: Payer: Self-pay | Admitting: Family Medicine

## 2012-01-25 ENCOUNTER — Other Ambulatory Visit: Payer: Self-pay

## 2012-01-25 DIAGNOSIS — F329 Major depressive disorder, single episode, unspecified: Secondary | ICD-10-CM

## 2012-01-25 MED ORDER — PAROXETINE HCL 10 MG PO TABS: 10.0000 mg | ORAL_TABLET | Freq: Every day | ORAL | Status: DC

## 2012-01-25 NOTE — Telephone Encounter (Signed)
meds reordered

## 2012-02-25 ENCOUNTER — Ambulatory Visit (INDEPENDENT_AMBULATORY_CARE_PROVIDER_SITE_OTHER): Payer: Self-pay | Admitting: Family Medicine

## 2012-02-25 ENCOUNTER — Encounter: Payer: Self-pay | Admitting: Family Medicine

## 2012-02-25 VITALS — BP 116/68 | HR 94 | Resp 18 | Ht 62.0 in | Wt 114.0 lb

## 2012-02-25 DIAGNOSIS — F411 Generalized anxiety disorder: Secondary | ICD-10-CM

## 2012-02-25 DIAGNOSIS — F329 Major depressive disorder, single episode, unspecified: Secondary | ICD-10-CM

## 2012-02-25 DIAGNOSIS — J40 Bronchitis, not specified as acute or chronic: Secondary | ICD-10-CM

## 2012-02-25 DIAGNOSIS — F419 Anxiety disorder, unspecified: Secondary | ICD-10-CM | POA: Insufficient documentation

## 2012-02-25 MED ORDER — PENICILLIN V POTASSIUM 250 MG PO TABS: 250.0000 mg | ORAL_TABLET | Freq: Four times a day (QID) | ORAL | Status: DC

## 2012-02-25 MED ORDER — BENZONATATE 100 MG PO CAPS: 100.0000 mg | ORAL_CAPSULE | Freq: Two times a day (BID) | ORAL | Status: DC | PRN

## 2012-02-25 MED ORDER — BENZONATATE 100 MG PO CAPS: 100.0000 mg | ORAL_CAPSULE | Freq: Four times a day (QID) | ORAL | Status: DC | PRN

## 2012-02-25 MED ORDER — ALPRAZOLAM 0.25 MG PO TABS: ORAL_TABLET | ORAL | Status: DC

## 2012-02-25 NOTE — Patient Instructions (Addendum)
F/u in 5.5 month  Penicilin and tessalon perles sen in for bronchitis  You need to go to [employer] , and seriously re examine how you will handle your current situation  Reduce the paxil to half daily, if still having blurred vision then stop and let me know.  Increase in xanax to one daily   Ask your father's Doctor about hospice if you think he is that ill, this will also help  All the best with cheerleading!
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
## 2012-02-27 NOTE — Assessment & Plan Note (Signed)
Good response to paxil continue same

## 2012-02-27 NOTE — Assessment & Plan Note (Signed)
Acute infection antibiotic and decongestant prescribed 
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
## 2012-03-11 ENCOUNTER — Other Ambulatory Visit: Payer: Self-pay | Admitting: Family Medicine

## 2012-03-11 ENCOUNTER — Telehealth: Payer: Self-pay | Admitting: Family Medicine

## 2012-03-11 MED ORDER — FLUCONAZOLE 150 MG PO TABS: ORAL_TABLET | ORAL | Status: DC

## 2012-03-11 NOTE — Telephone Encounter (Signed)
Fluconazole sent in please let her know

## 2012-07-01 ENCOUNTER — Encounter: Payer: Self-pay | Admitting: Family Medicine

## 2012-07-01 ENCOUNTER — Ambulatory Visit (INDEPENDENT_AMBULATORY_CARE_PROVIDER_SITE_OTHER): Payer: Self-pay | Admitting: Family Medicine

## 2012-07-01 VITALS — BP 122/70 | HR 82 | Temp 98.5°F | Resp 18 | Ht 62.0 in | Wt 116.0 lb

## 2012-07-01 DIAGNOSIS — F3289 Other specified depressive episodes: Secondary | ICD-10-CM

## 2012-07-01 DIAGNOSIS — J209 Acute bronchitis, unspecified: Secondary | ICD-10-CM | POA: Insufficient documentation

## 2012-07-01 DIAGNOSIS — J0100 Acute maxillary sinusitis, unspecified: Secondary | ICD-10-CM

## 2012-07-01 DIAGNOSIS — F329 Major depressive disorder, single episode, unspecified: Secondary | ICD-10-CM

## 2012-07-01 MED ORDER — BENZONATATE 100 MG PO CAPS: ORAL_CAPSULE | ORAL | Status: DC

## 2012-07-01 MED ORDER — SULFAMETHOXAZOLE-TRIMETHOPRIM 800-160 MG PO TABS: 1.0000 | ORAL_TABLET | Freq: Two times a day (BID) | ORAL | Status: AC

## 2012-07-01 MED ORDER — FLUCONAZOLE 150 MG PO TABS: ORAL_TABLET | ORAL | Status: AC

## 2012-07-01 MED ORDER — CEFTRIAXONE SODIUM 1 G IJ SOLR
500.0000 mg | Freq: Once | INTRAMUSCULAR | Status: AC
  Administered 2012-07-01: 500 mg via INTRAMUSCULAR

## 2012-07-01 NOTE — Assessment & Plan Note (Signed)
Antibiotics prescribed 

## 2012-07-01 NOTE — Progress Notes (Signed)
  Subjective:    Patient ID: [name], female    DOB: 1986/02/02, 27 y.o.   MRN: [id number]  HPI 1 week h/o head and chest congestion, chills and fever intermittently, entire family has been sick. Sputum and nasal drainage is green. States had tonsillitis since last seen. Reports improvement in anxiety and depression, depends on xanax only, smoking and drinking less reportedly   Review of Systems See HPI Denies chest pains, palpitations and leg swelling Denies abdominal pain, nausea, vomiting,diarrhea or constipation.   Denies dysuria, frequency, hesitancy or incontinence. Denies joint pain, swelling and limitation in mobility. Denies headaches, seizures, numbness, or tingling. Denies uncontrolled depression, anxiety or insomnia. Denies skin break down or rash.        Objective:   Physical Exam  Patient alert and oriented and in no cardiopulmonary distress.ill appearing  HEENT: No facial asymmetry, EOMI, maxillary  sinus tenderness,  oropharynx pink and moist.  Neck supple anterior cervical adenopathy.  Chest: decreased air entry, bilateral crackles few wheezes CVS: S1, S2 no murmurs, no S3.  ABD: Soft non tender. Bowel sounds normal.  Ext: No edema    Psych: Good eye contact, normal affect. Memory intact not anxious or depressed appearing.  CNS: CN 2-12 intact, power, tone and sensation normal throughout.       Assessment & Plan:

## 2012-07-01 NOTE — Assessment & Plan Note (Signed)
Antibiotics, decongestant and Rocephin 500mg  IM in the office

## 2012-07-01 NOTE — Patient Instructions (Addendum)
F/u in 6 month, CALL IF YOU NEED ME BEFORE.  CANCEL SOONER APPT.  YOU ARE TREATED FOR ACUTE SINSUTIS AND BRONCHITIS.  ROCEPHIN 500MG  IM ADMINISTERED AND SEPTRA, DECONGESTANT AND 1 TABLET FOR YEAST INFECTION SENT TO YOUR PHARMACY  I AM THANKFUL THAT YOU ARE DOING BETTER AND HOPE YOU CONTINUE TO DO SO.

## 2012-07-01 NOTE — Assessment & Plan Note (Signed)
Improved relies on xanax only

## 2012-07-11 ENCOUNTER — Telehealth: Payer: Self-pay | Admitting: Family Medicine

## 2012-07-12 ENCOUNTER — Other Ambulatory Visit: Payer: Self-pay | Admitting: Family Medicine

## 2012-07-12 ENCOUNTER — Other Ambulatory Visit: Payer: Self-pay

## 2012-07-12 MED ORDER — PENICILLIN V POTASSIUM 250 MG PO TABS: 250.0000 mg | ORAL_TABLET | Freq: Four times a day (QID) | ORAL | Status: DC

## 2012-07-12 NOTE — Telephone Encounter (Signed)
Patient states that she started taking abt the day after appointment.  She states that around 2/1 or 2/2 she started to have headache, sore throat, neck stiffness, and tachycardia.   She stopped the medication and still has all the symptoms except the tachycardia.  Please advise.

## 2012-07-12 NOTE — Telephone Encounter (Signed)
I have entered pen v 250 mg for 1 week, she needs to take that in place of the septra since she reports advers reaction to the septra

## 2012-07-12 NOTE — Telephone Encounter (Signed)
Patient aware.

## 2012-07-12 NOTE — Telephone Encounter (Signed)
If headache and neck stiffness are a big concern now needs Ed eval;uation for this let her know

## 2012-11-29 ENCOUNTER — Other Ambulatory Visit: Payer: Self-pay

## 2012-11-29 DIAGNOSIS — F419 Anxiety disorder, unspecified: Secondary | ICD-10-CM

## 2012-11-29 MED ORDER — ALPRAZOLAM 0.25 MG PO TABS: ORAL_TABLET | ORAL | Status: DC

## 2018-10-07 DIAGNOSIS — I609 Nontraumatic subarachnoid hemorrhage, unspecified: Secondary | ICD-10-CM

## 2018-10-07 HISTORY — DX: Nontraumatic subarachnoid hemorrhage, unspecified: I60.9

## 2018-10-07 HISTORY — PX: CRANIOTOMY: SHX93

## 2019-02-09 ENCOUNTER — Encounter (HOSPITAL_COMMUNITY): Payer: Self-pay | Admitting: Psychiatry

## 2019-02-09 ENCOUNTER — Other Ambulatory Visit: Payer: Self-pay

## 2019-02-09 ENCOUNTER — Ambulatory Visit (INDEPENDENT_AMBULATORY_CARE_PROVIDER_SITE_OTHER): Payer: 59 | Admitting: Psychiatry

## 2019-02-09 DIAGNOSIS — F410 Panic disorder [episodic paroxysmal anxiety] without agoraphobia: Secondary | ICD-10-CM | POA: Insufficient documentation

## 2019-02-09 DIAGNOSIS — F331 Major depressive disorder, recurrent, moderate: Secondary | ICD-10-CM | POA: Diagnosis not present

## 2019-02-09 DIAGNOSIS — F419 Anxiety disorder, unspecified: Secondary | ICD-10-CM | POA: Diagnosis not present

## 2019-02-09 DIAGNOSIS — F411 Generalized anxiety disorder: Secondary | ICD-10-CM

## 2019-02-09 DIAGNOSIS — F330 Major depressive disorder, recurrent, mild: Secondary | ICD-10-CM | POA: Insufficient documentation

## 2019-02-09 MED ORDER — MIRTAZAPINE 15 MG PO TABS: 15.0000 mg | ORAL_TABLET | Freq: Every day | ORAL | 0 refills | Status: DC

## 2019-02-09 MED ORDER — ALPRAZOLAM 0.5 MG PO TABS: 0.5000 mg | ORAL_TABLET | Freq: Two times a day (BID) | ORAL | 0 refills | Status: AC | PRN
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
## 2019-02-24 ENCOUNTER — Other Ambulatory Visit (HOSPITAL_COMMUNITY): Payer: Self-pay

## 2019-03-13 ENCOUNTER — Other Ambulatory Visit: Payer: Self-pay

## 2019-03-13 ENCOUNTER — Ambulatory Visit (INDEPENDENT_AMBULATORY_CARE_PROVIDER_SITE_OTHER): Payer: 59 | Admitting: Psychiatry

## 2019-03-13 DIAGNOSIS — F410 Panic disorder [episodic paroxysmal anxiety] without agoraphobia: Secondary | ICD-10-CM

## 2019-03-13 DIAGNOSIS — F331 Major depressive disorder, recurrent, moderate: Secondary | ICD-10-CM

## 2019-03-13 DIAGNOSIS — F411 Generalized anxiety disorder: Secondary | ICD-10-CM

## 2019-03-13 MED ORDER — ALPRAZOLAM 0.5 MG PO TABS: 0.5000 mg | ORAL_TABLET | Freq: Two times a day (BID) | ORAL | 1 refills | Status: DC | PRN

## 2019-03-13 MED ORDER — MIRTAZAPINE 30 MG PO TABS: 30.0000 mg | ORAL_TABLET | Freq: Every day | ORAL | 1 refills | Status: DC
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
## 2019-03-27 ENCOUNTER — Other Ambulatory Visit: Payer: Self-pay

## 2019-03-27 ENCOUNTER — Ambulatory Visit (INDEPENDENT_AMBULATORY_CARE_PROVIDER_SITE_OTHER): Payer: 59 | Admitting: Psychology

## 2019-03-27 ENCOUNTER — Encounter (HOSPITAL_COMMUNITY): Payer: Self-pay | Admitting: Psychology

## 2019-03-27 DIAGNOSIS — F4310 Post-traumatic stress disorder, unspecified: Secondary | ICD-10-CM

## 2019-03-27 DIAGNOSIS — F411 Generalized anxiety disorder: Secondary | ICD-10-CM | POA: Diagnosis not present

## 2019-03-27 DIAGNOSIS — F331 Major depressive disorder, recurrent, moderate: Secondary | ICD-10-CM | POA: Diagnosis not present
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
## 2019-04-11 ENCOUNTER — Ambulatory Visit (INDEPENDENT_AMBULATORY_CARE_PROVIDER_SITE_OTHER): Payer: 59 | Admitting: Psychology

## 2019-04-11 ENCOUNTER — Other Ambulatory Visit: Payer: Self-pay

## 2019-04-11 DIAGNOSIS — F331 Major depressive disorder, recurrent, moderate: Secondary | ICD-10-CM

## 2019-04-11 DIAGNOSIS — F411 Generalized anxiety disorder: Secondary | ICD-10-CM | POA: Diagnosis not present

## 2019-04-11 DIAGNOSIS — F4310 Post-traumatic stress disorder, unspecified: Secondary | ICD-10-CM
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
## 2019-04-19 ENCOUNTER — Other Ambulatory Visit: Payer: Self-pay

## 2019-04-19 ENCOUNTER — Ambulatory Visit (INDEPENDENT_AMBULATORY_CARE_PROVIDER_SITE_OTHER): Payer: 59 | Admitting: Psychiatry

## 2019-04-19 DIAGNOSIS — F411 Generalized anxiety disorder: Secondary | ICD-10-CM | POA: Diagnosis not present

## 2019-04-19 DIAGNOSIS — F331 Major depressive disorder, recurrent, moderate: Secondary | ICD-10-CM

## 2019-04-19 DIAGNOSIS — F410 Panic disorder [episodic paroxysmal anxiety] without agoraphobia: Secondary | ICD-10-CM

## 2019-04-19 MED ORDER — ALPRAZOLAM 0.5 MG PO TABS: 0.5000 mg | ORAL_TABLET | Freq: Two times a day (BID) | ORAL | 1 refills | Status: DC | PRN

## 2019-04-19 MED ORDER — MIRTAZAPINE 30 MG PO TABS: 30.0000 mg | ORAL_TABLET | Freq: Every day | ORAL | 1 refills | Status: DC
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
## 2019-04-25 ENCOUNTER — Other Ambulatory Visit: Payer: Self-pay

## 2019-04-25 ENCOUNTER — Ambulatory Visit (INDEPENDENT_AMBULATORY_CARE_PROVIDER_SITE_OTHER): Payer: 59 | Admitting: Psychology

## 2019-04-25 DIAGNOSIS — F331 Major depressive disorder, recurrent, moderate: Secondary | ICD-10-CM

## 2019-04-25 DIAGNOSIS — F4310 Post-traumatic stress disorder, unspecified: Secondary | ICD-10-CM

## 2019-04-25 DIAGNOSIS — F411 Generalized anxiety disorder: Secondary | ICD-10-CM

## 2019-04-25 NOTE — Progress Notes (Signed)
Virtual Visit via Video Note  I connected with [name] on 04/25/19 at 10:00 AM EST by a video enabled telemedicine application and verified that I am speaking with the correct person using two identifiers.   I discussed the limitations of evaluation and management by telemedicine and the availability of in person appointments. The patient expressed understanding and agreed to proceed.    I discussed the assessment and treatment plan with the patient. The patient was provided an opportunity to ask questions and all were answered. The patient agreed with the plan and demonstrated an understanding of the instructions.   The patient was advised to call back or seek an in-person evaluation if the symptoms worsen or if the condition fails to improve as anticipated.  I provided 50 minutes of non-face-to-face time during this encounter.   [name] [facility]    THERAPIST PROGRESS NOTE  Session Time: 10am-10.50am  Participation Level: Active  Behavioral Response: Well GroomedAlertaffect anxious, depressed  Type of Therapy: Individual Therapy  Treatment Goals addressed: Diagnosis: MDD, GAD, PtSD and goal 1.  Interventions: CBT and Supportive  Summary: [name] is a 33 y.o. female who presents with affect congruent w/ report of anxiety and depressed.  Pt reported that she is walking and putting into walk grounding  Practices at the end of walk as discussed.  Pt reports that is helping to relax self.  Pt reported that she had a family session w/ her girls and became more aware of their feelings and how this has impacted her girls.  Pt reported that was hard to hear but good to become aware.  Pt discussed that has still been very tense around house.  Pt discussed that needs space from relationship and when ever discusses w/ husband turns into argument.  Pt reports doesn't see him working towards change and seeking help as agreed.  Pt reported that she is keeping boundaries w/her family  that aren't being supportive and girls of her because don't agree w/ her decision.  Pt reported she is focused on caring for self and her children and coping through that.  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 Pt did report positive of friend's visit this weekend and being able to engage and enjoy interactions.  Suicidal/Homicidal: Nowithout intent/plan  Therapist Response: Assessed pt current functioning per pt report.  Explored w/ pt use of her coping skills and impact having.  Processed w/pt her feeling/thoughts re: daughters' experience and increased awareness of their feelings.  Discussed healthy boundaries in relationships and discussed her focus on her self care, caring for children and express her needs.   Plan: Return again in 2 weeks, via webex.  Pt to f/u as scheduled w/ Dr. Montel Culver. Diagnosis: MDD, GAD, PTSD   [name], [facility] 04/25/2019

## 2019-05-09 ENCOUNTER — Encounter (HOSPITAL_COMMUNITY): Payer: Self-pay

## 2019-05-09 ENCOUNTER — Ambulatory Visit (INDEPENDENT_AMBULATORY_CARE_PROVIDER_SITE_OTHER): Payer: 59 | Admitting: Psychology

## 2019-05-09 ENCOUNTER — Other Ambulatory Visit: Payer: Self-pay

## 2019-05-09 DIAGNOSIS — F4310 Post-traumatic stress disorder, unspecified: Secondary | ICD-10-CM

## 2019-05-09 DIAGNOSIS — F331 Major depressive disorder, recurrent, moderate: Secondary | ICD-10-CM

## 2019-05-09 DIAGNOSIS — F411 Generalized anxiety disorder: Secondary | ICD-10-CM | POA: Diagnosis not present
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
## 2019-05-23 ENCOUNTER — Ambulatory Visit (INDEPENDENT_AMBULATORY_CARE_PROVIDER_SITE_OTHER): Payer: 59 | Admitting: Psychology

## 2019-05-23 ENCOUNTER — Other Ambulatory Visit: Payer: Self-pay

## 2019-05-23 DIAGNOSIS — F331 Major depressive disorder, recurrent, moderate: Secondary | ICD-10-CM

## 2019-05-23 DIAGNOSIS — F4310 Post-traumatic stress disorder, unspecified: Secondary | ICD-10-CM

## 2019-05-23 NOTE — Progress Notes (Signed)
Virtual Visit via Video Note  I connected with [name] on 05/23/19 at 10:00 AM EST by a video enabled telemedicine application and verified that I am speaking with the correct person using two identifiers.   I discussed the limitations of evaluation and management by telemedicine and the availability of in person appointments. The patient expressed understanding and agreed to proceed.  History of Present Illness:    Observations/Objective:   Assessment and Plan:   Follow Up Instructions:    I discussed the assessment and treatment plan with the patient. The patient was provided an opportunity to ask questions and all were answered. The patient agreed with the plan and demonstrated an understanding of the instructions.   The patient was advised to call back or seek an in-person evaluation if the symptoms worsen or if the condition fails to improve as anticipated.  I provided 28 minutes of non-face-to-face time during this encounter.   [name] [facility]    THERAPIST PROGRESS NOTE  Session Time: 10am-10.28am  Participation Level: Active  Behavioral Response: Well GroomedAlertaffect wnl  Type of Therapy: Individual Therapy  Treatment Goals addressed: Diagnosis: PTSd and goal 1.  Interventions: CBT and Strength-based  Summary: [name] is a 33 y.o. female who presents with affect wnl.  pt reported she started back to work on 05/15/19 and that has been good. Pt reported that she feels good about being able to do things she knows how to do and pick back up where left off.  Pt reported also good for getting out of house and having something to focus on.  Pt reported that she has felt more at ease.  Pt does attribute to husband being out of house.  Pt reported that met w/ CPS for home visit and could tell that they would have removed her girls if husband was still present. Pt did sign safety plan that husband would not live in house. Pt reported that w/ space she is  beginning to heal and process through things.  Pt reports that she has also noticed taking less Xanax some days only 1.  Pt reported that had to end appointment early w/ needing to return to work.    Suicidal/Homicidal: Nowithout intent/plan  Therapist Response: Assessed pt current functioning per pt report. Processed w/pt continued transition w/ husband out of house, her return to work.  Reflected improved w/ mood and discussed finding ways to attend to her self care as well work and parenting.  Plan: Return again in 2 weeks, via webex.  Pt to f/u as scheduled w/ Dr. Montel Culver.   Diagnosis: PTSD, MDD   [name], [facility] 05/23/2019

## 2019-05-30 ENCOUNTER — Other Ambulatory Visit: Payer: Self-pay

## 2019-05-30 ENCOUNTER — Ambulatory Visit (INDEPENDENT_AMBULATORY_CARE_PROVIDER_SITE_OTHER): Payer: 59 | Admitting: Psychiatry

## 2019-05-30 DIAGNOSIS — F410 Panic disorder [episodic paroxysmal anxiety] without agoraphobia: Secondary | ICD-10-CM | POA: Diagnosis not present

## 2019-05-30 DIAGNOSIS — F331 Major depressive disorder, recurrent, moderate: Secondary | ICD-10-CM

## 2019-05-30 DIAGNOSIS — F411 Generalized anxiety disorder: Secondary | ICD-10-CM | POA: Diagnosis not present

## 2019-05-30 MED ORDER — MIRTAZAPINE 30 MG PO TABS: 30.0000 mg | ORAL_TABLET | Freq: Every day | ORAL | 1 refills | Status: DC

## 2019-05-30 MED ORDER — ALPRAZOLAM 0.5 MG PO TABS: 0.5000 mg | ORAL_TABLET | Freq: Two times a day (BID) | ORAL | 1 refills | Status: DC | PRN
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
## 2019-06-06 ENCOUNTER — Other Ambulatory Visit: Payer: Self-pay

## 2019-06-06 ENCOUNTER — Ambulatory Visit (INDEPENDENT_AMBULATORY_CARE_PROVIDER_SITE_OTHER): Payer: 59 | Admitting: Psychology

## 2019-06-06 DIAGNOSIS — F331 Major depressive disorder, recurrent, moderate: Secondary | ICD-10-CM | POA: Diagnosis not present

## 2019-06-06 DIAGNOSIS — F4310 Post-traumatic stress disorder, unspecified: Secondary | ICD-10-CM

## 2019-06-06 NOTE — Progress Notes (Signed)
Virtual Visit via Video Note  I connected with [name] on 06/06/19 at 10:00 AM EST by a video enabled telemedicine application and verified that I am speaking with the correct person using two identifiers.   I discussed the limitations of evaluation and management by telemedicine and the availability of in person appointments. The patient expressed understanding and agreed to proceed.    I discussed the assessment and treatment plan with the patient. The patient was provided an opportunity to ask questions and all were answered. The patient agreed with the plan and demonstrated an understanding of the instructions.   The patient was advised to call back or seek an in-person evaluation if the symptoms worsen or if the condition fails to improve as anticipated.  I provided 27 minutes of non-face-to-face time during this encounter.   [name] [facility]    THERAPIST PROGRESS NOTE  Session Time: 10am-10.27am  Participation Level: Active  Behavioral Response: Well GroomedAlertaffect wnl  Type of Therapy: Individual Therapy  Treatment Goals addressed: Diagnosis: PTSD, MDD and goal 1.  Interventions: CBT and Strength-based  Summary: [name] is a 33 y.o. female who presents with affect wnl.  pt is on a break from work.  Pt reported that she has been busy w/ work and has been doing well w/ work.  Pt reported that the holidays were emotional for her w/ being separated from husband and mixed emotions w/ this.  Pt reported that did spend time w/ girls w/ husband's family and then time w/ bestfriend as well.  Pt reported that being w/ bestfriend was good decision and they were all able to laugh and enjoy each other.  Pt reported as times feels peace and other times misses husband and other times resentment.  Pt reported that she is focusing on being present and making best of each day and moving forward for self and daughters.  Pt is acknowledging and working towards reframing when  being to hard on self or ruminating on past.     Suicidal/Homicidal: Nowithout intent/plan  Therapist Response: Assessed pt current functioning per pt report.  Processed w/pt coping w/ interactions, separation, healing for self and daughters.  assisted pt in validating, normalizing feelings and reframing.   Plan: Return again in 2 weeks, via webex.  Pt to f/u as scheduled w/ Dr. Montel Culver.  Diagnosis: MDD, PTSD   [name], [facility] 06/06/2019

## 2019-06-22 ENCOUNTER — Other Ambulatory Visit: Payer: Self-pay

## 2019-06-22 ENCOUNTER — Ambulatory Visit (INDEPENDENT_AMBULATORY_CARE_PROVIDER_SITE_OTHER): Payer: 59 | Admitting: Psychology

## 2019-06-22 DIAGNOSIS — F4310 Post-traumatic stress disorder, unspecified: Secondary | ICD-10-CM | POA: Diagnosis not present
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
## 2019-07-06 ENCOUNTER — Ambulatory Visit (INDEPENDENT_AMBULATORY_CARE_PROVIDER_SITE_OTHER): Payer: 59 | Admitting: Psychology

## 2019-07-06 ENCOUNTER — Other Ambulatory Visit: Payer: Self-pay

## 2019-07-06 DIAGNOSIS — F4310 Post-traumatic stress disorder, unspecified: Secondary | ICD-10-CM | POA: Diagnosis not present

## 2019-07-06 NOTE — Progress Notes (Signed)
Virtual Visit via Video Note  I connected with [name] on 07/06/19 at  3:30 PM EST by a video enabled telemedicine application and verified that I am speaking with the correct person using two identifiers.   I discussed the limitations of evaluation and management by telemedicine and the availability of in person appointments. The patient expressed understanding and agreed to proceed.  History of Present Illness:    Observations/Objective:   Assessment and Plan:   Follow Up Instructions:    I discussed the assessment and treatment plan with the patient. The patient was provided an opportunity to ask questions and all were answered. The patient agreed with the plan and demonstrated an understanding of the instructions.   The patient was advised to call back or seek an in-person evaluation if the symptoms worsen or if the condition fails to improve as anticipated.  I provided 53 minutes of non-face-to-face time during this encounter.   Forde Radon [facility]    THERAPIST PROGRESS NOTE  Session Time: 3.32pm-4.25pm  Participation Level: Active  Behavioral Response: Well GroomedAlertaffect wnl  Type of Therapy: Individual Therapy  Treatment Goals addressed: Diagnosis: ptsd and goal 1.  Interventions: CBT, Strength-based and Other: setting boundaries  Summary: [name] is a 34 y.o. female who presents with affect wnl.  pt reported that she is still feeling very stressed w/ just managing w/ work, single parent, kids mental health, her own mental health and college classes.  Pt reported that she continues to feel more at peace for self and utilizing her time for coping.  Pt reported that talk w/her husband she finds irritating and acknowledging that he is not changing and doesn't hear what she is needing.  Pt good insight that she has been making changes and moving forward and that he is in same old patterns.  Pt reported that is attempting to set boundaries and that  husband not open to hearing at this point. Pt acknowledged some changes making this week for time management and letting go of things that aren't serving her.  Pt stated that she feeling more settle w/ being physcially separated and moving forward for self and daughters.  Suicidal/Homicidal: Nowithout intent/plan  Therapist Response: Assessed pt current functioning per pt report.  Processed w/pt coping w/ stressors work, school and parenting balance.   Assisted pt in expressing her thoughts and feelings and identifying progress she is making.  Explored w/ pt boundaries she is setting for coping and limiting her stressors.  .    Plan: Return again in 2 weeks, via webex.  F/u as scheduled with Dr. Hinton Dyer.   Diagnosis: PTSD  Forde Radon [facility] 07/06/2019

## 2019-07-19 ENCOUNTER — Other Ambulatory Visit: Payer: Self-pay

## 2019-07-19 ENCOUNTER — Ambulatory Visit (INDEPENDENT_AMBULATORY_CARE_PROVIDER_SITE_OTHER): Payer: 59 | Admitting: Psychiatry

## 2019-07-19 DIAGNOSIS — F411 Generalized anxiety disorder: Secondary | ICD-10-CM

## 2019-07-19 DIAGNOSIS — F331 Major depressive disorder, recurrent, moderate: Secondary | ICD-10-CM | POA: Diagnosis not present

## 2019-07-19 DIAGNOSIS — F410 Panic disorder [episodic paroxysmal anxiety] without agoraphobia: Secondary | ICD-10-CM | POA: Diagnosis not present

## 2019-07-19 MED ORDER — MIRTAZAPINE 30 MG PO TABS: 30.0000 mg | ORAL_TABLET | Freq: Every day | ORAL | 2 refills | Status: DC

## 2019-07-19 MED ORDER — ALPRAZOLAM 0.5 MG PO TABS: 0.5000 mg | ORAL_TABLET | Freq: Two times a day (BID) | ORAL | 2 refills | Status: DC | PRN
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
## 2019-07-20 ENCOUNTER — Other Ambulatory Visit: Payer: Self-pay

## 2019-07-20 ENCOUNTER — Ambulatory Visit (INDEPENDENT_AMBULATORY_CARE_PROVIDER_SITE_OTHER): Payer: 59 | Admitting: Psychology

## 2019-07-20 DIAGNOSIS — F411 Generalized anxiety disorder: Secondary | ICD-10-CM

## 2019-07-20 DIAGNOSIS — F4310 Post-traumatic stress disorder, unspecified: Secondary | ICD-10-CM | POA: Diagnosis not present

## 2019-07-20 DIAGNOSIS — F331 Major depressive disorder, recurrent, moderate: Secondary | ICD-10-CM
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
## 2019-08-03 ENCOUNTER — Ambulatory Visit (INDEPENDENT_AMBULATORY_CARE_PROVIDER_SITE_OTHER): Payer: 59 | Admitting: Psychology

## 2019-08-03 ENCOUNTER — Other Ambulatory Visit: Payer: Self-pay

## 2019-08-03 DIAGNOSIS — F331 Major depressive disorder, recurrent, moderate: Secondary | ICD-10-CM

## 2019-08-03 DIAGNOSIS — F411 Generalized anxiety disorder: Secondary | ICD-10-CM

## 2019-08-03 DIAGNOSIS — F4310 Post-traumatic stress disorder, unspecified: Secondary | ICD-10-CM

## 2019-08-03 NOTE — Progress Notes (Signed)
Virtual Visit via Video Note  I connected with [name] on 08/03/19 at  3:30 PM EST by a video enabled telemedicine application and verified that I am speaking with the correct person using two identifiers.   I discussed the limitations of evaluation and management by telemedicine and the availability of in person appointments. The patient expressed understanding and agreed to proceed.   I discussed the assessment and treatment plan with the patient. The patient was provided an opportunity to ask questions and all were answered. The patient agreed with the plan and demonstrated an understanding of the instructions.   The patient was advised to call back or seek an in-person evaluation if the symptoms worsen or if the condition fails to improve as anticipated.  I provided 47 minutes of non-face-to-face time during this encounter.   Forde Radon [facility]    THERAPIST PROGRESS NOTE  Session Time: 3.30pm-4.17pm  Participation Level: Active  Behavioral Response: Well GroomedAlertaffect wnl  Type of Therapy: Individual Therapy  Treatment Goals addressed: Diagnosis: PTSD, GAD, MDD and goal 1.  Interventions: CBT, Supportive and Other: boundary settting  Summary: [name] is a 34 y.o. female who presents with affect wnl.  pt reported that has been some stress in past 2 weeks.  Pt reported that she made the mistake of letting husband come to stay a couple nights to visit w/ the children and felt like helping as closer to work.  Pt reported that shortly into stay could smell and see that he is continuing to drink.  Pt reported Monday night- his interactions were frustration, couldn't concentrate on her school work and she went to bed.  Kids woke later as he was having a "temper tantrum" and naming call and cursing.  Pt discussed how she recognizes that letting him back in to home has set things back and that she is able to function better w/out him present. Pt reported she will leave for  weekend and set boundary that he needs to be gone.   Suicidal/Homicidal: Nowithout intent/plan  Therapist Response: Assessed pt current functioning per pt report. Processed w/pt coping w/ stressors in the past week.  Explored w/pt her feelings, how things aren't changing w/ him present and steps she is taking to reestablish boundaries .  Plan: Return again in 2 weeks, via webex.  F/u as scheduled w/ Dr. Hinton Dyer.  Diagnosis: PTSD, GAD, MDD   Forde Radon, [facility] 08/03/2019

## 2019-08-17 ENCOUNTER — Ambulatory Visit (HOSPITAL_COMMUNITY): Payer: 59 | Admitting: Psychology

## 2019-08-31 ENCOUNTER — Ambulatory Visit (INDEPENDENT_AMBULATORY_CARE_PROVIDER_SITE_OTHER): Payer: 59 | Admitting: Psychology

## 2019-08-31 ENCOUNTER — Other Ambulatory Visit: Payer: Self-pay

## 2019-08-31 DIAGNOSIS — F4310 Post-traumatic stress disorder, unspecified: Secondary | ICD-10-CM

## 2019-08-31 NOTE — Progress Notes (Signed)
Virtual Visit via Video Note  I connected with [name] on 08/31/19 at  3:30 PM EDT by a video enabled telemedicine application and verified that I am speaking with the correct person using two identifiers.   I discussed the limitations of evaluation and management by telemedicine and the availability of in person appointments. The patient expressed understanding and agreed to proceed.    I discussed the assessment and treatment plan with the patient. The patient was provided an opportunity to ask questions and all were answered. The patient agreed with the plan and demonstrated an understanding of the instructions.   The patient was advised to call back or seek an in-person evaluation if the symptoms worsen or if the condition fails to improve as anticipated.  I provided 51 minutes of non-face-to-face time during this encounter.   Forde Radon [facility]    THERAPIST PROGRESS NOTE  Session Time: 3.30pm-4.21pm  Participation Level: Active  Behavioral Response: Well GroomedAlertaffect wnl  Type of Therapy: Individual Therapy  Treatment Goals addressed: Diagnosis: PTSD and goal 1.  Interventions: CBT and Strength-based  Summary: Dandra Shambaugh is a 34 y.o. female who presents with affect wnl.  pt reported that she is doing ok.  Pt reports she is sleeping well- getting a little less sleep w/ trying to get school work complete.  Pt reported that she is trying to balance school, work, being mom and self care.  pt reported she is communicating better w/her children and focused on listenting more and this has been a benefit. Pt reported that she is aware that she needs to find a way for more alone time and has joined gym to have work out after work and this is positive for self care.  Pt reported that her husband is back out of the house and staying in hotel nearby- pt reports talk almost daily but that she has enjoyed have her space in the house and sense of peace and security w/ that.  Pt  reported that having to work on saturdays to get in 40 hours for days they don't have enough work.  Pt reported awareness that she is jumpy- startles easily.  Pt increased awareness of symptom of PtSD.   Suicidal/Homicidal: Nowithout intent/plan  Therapist Response: Assessed pt current functioning per pt report. Processed w/pt coping w/ balance of multiple roles and ways of encouraging self, setting boundaries and finding ways to incorporate her self care.  Discussed communication w/ daughters.  Explored w/pt the importance of movement and healing through movement that is safe and she is in control of.    Plan: Return again in 2 weeks,via webex.  Next available scheduled and pt placed on cancellation list. F/u as scheduled w/ Dr. Hinton Dyer.  Diagnosis: PTSD   Forde Radon [facility] 08/31/2019

## 2019-10-03 ENCOUNTER — Ambulatory Visit (HOSPITAL_COMMUNITY): Payer: 59 | Admitting: Psychology

## 2019-10-17 ENCOUNTER — Telehealth (INDEPENDENT_AMBULATORY_CARE_PROVIDER_SITE_OTHER): Payer: 59 | Admitting: Psychiatry

## 2019-10-17 ENCOUNTER — Other Ambulatory Visit: Payer: Self-pay

## 2019-10-17 DIAGNOSIS — F330 Major depressive disorder, recurrent, mild: Secondary | ICD-10-CM | POA: Diagnosis not present

## 2019-10-17 DIAGNOSIS — F410 Panic disorder [episodic paroxysmal anxiety] without agoraphobia: Secondary | ICD-10-CM

## 2019-10-17 DIAGNOSIS — F411 Generalized anxiety disorder: Secondary | ICD-10-CM | POA: Diagnosis not present

## 2019-10-17 MED ORDER — MIRTAZAPINE 30 MG PO TABS: 30.0000 mg | ORAL_TABLET | Freq: Every day | ORAL | 2 refills | Status: DC

## 2019-10-17 MED ORDER — ALPRAZOLAM 0.5 MG PO TABS: 0.5000 mg | ORAL_TABLET | Freq: Two times a day (BID) | ORAL | 2 refills | Status: DC | PRN
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
## 2019-10-19 ENCOUNTER — Ambulatory Visit (INDEPENDENT_AMBULATORY_CARE_PROVIDER_SITE_OTHER): Payer: 59 | Admitting: Psychology

## 2019-10-19 ENCOUNTER — Other Ambulatory Visit: Payer: Self-pay

## 2019-10-19 DIAGNOSIS — F4310 Post-traumatic stress disorder, unspecified: Secondary | ICD-10-CM | POA: Diagnosis not present
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
## 2019-10-30 ENCOUNTER — Ambulatory Visit (HOSPITAL_COMMUNITY): Payer: 59 | Admitting: Psychology

## 2019-12-14 ENCOUNTER — Telehealth (INDEPENDENT_AMBULATORY_CARE_PROVIDER_SITE_OTHER): Payer: 59 | Admitting: Psychiatry

## 2019-12-14 ENCOUNTER — Other Ambulatory Visit: Payer: Self-pay

## 2019-12-14 DIAGNOSIS — F330 Major depressive disorder, recurrent, mild: Secondary | ICD-10-CM | POA: Diagnosis not present

## 2019-12-14 DIAGNOSIS — F410 Panic disorder [episodic paroxysmal anxiety] without agoraphobia: Secondary | ICD-10-CM | POA: Diagnosis not present

## 2019-12-14 DIAGNOSIS — F411 Generalized anxiety disorder: Secondary | ICD-10-CM | POA: Diagnosis not present

## 2019-12-14 MED ORDER — ALPRAZOLAM 0.5 MG PO TABS: 0.5000 mg | ORAL_TABLET | Freq: Two times a day (BID) | ORAL | 2 refills | Status: DC | PRN

## 2019-12-14 MED ORDER — MIRTAZAPINE 30 MG PO TABS: 30.0000 mg | ORAL_TABLET | Freq: Every day | ORAL | 2 refills | Status: DC
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
## 2020-01-18 ENCOUNTER — Telehealth (HOSPITAL_COMMUNITY): Payer: 59 | Admitting: Psychiatry

## 2020-02-14 ENCOUNTER — Telehealth (INDEPENDENT_AMBULATORY_CARE_PROVIDER_SITE_OTHER): Payer: 59 | Admitting: Psychiatry

## 2020-02-14 ENCOUNTER — Other Ambulatory Visit: Payer: Self-pay

## 2020-02-14 DIAGNOSIS — F411 Generalized anxiety disorder: Secondary | ICD-10-CM

## 2020-02-14 DIAGNOSIS — F410 Panic disorder [episodic paroxysmal anxiety] without agoraphobia: Secondary | ICD-10-CM | POA: Diagnosis not present

## 2020-02-14 DIAGNOSIS — F330 Major depressive disorder, recurrent, mild: Secondary | ICD-10-CM | POA: Diagnosis not present
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
## 2020-02-23 ENCOUNTER — Ambulatory Visit (INDEPENDENT_AMBULATORY_CARE_PROVIDER_SITE_OTHER): Payer: Managed Care, Other (non HMO)

## 2020-02-23 ENCOUNTER — Other Ambulatory Visit: Payer: Self-pay

## 2020-02-23 DIAGNOSIS — Z23 Encounter for immunization: Secondary | ICD-10-CM

## 2020-02-23 NOTE — Progress Notes (Signed)
   Covid-19 Vaccination Clinic  Name:  Chantale Leugers    MRN: [id number] DOB: 12-23-1985  02/23/2020  Ms. Lucarelli was observed post Covid-19 immunization for 15 minutes without incident. She was provided with Vaccine Information Sheet and instruction to access the V-Safe system.   Ms. Fiorenza was instructed to call 911 with any severe reactions post vaccine: Marland Kitchen Difficulty breathing  . Swelling of face and throat  . A fast heartbeat  . A bad rash all over body  . Dizziness and weakness   Immunizations Administered    Name Date Dose VIS Date Route   Pfizer COVID-19 Vaccine 02/23/2020  1:12 PM 0.3 mL 08/02/2018 Intramuscular   Manufacturer: [employer], [employer]   Lot: [id number]   NDC: [address]-3

## 2020-03-07 ENCOUNTER — Other Ambulatory Visit: Payer: Self-pay

## 2020-03-07 ENCOUNTER — Other Ambulatory Visit: Payer: Managed Care, Other (non HMO)

## 2020-03-07 DIAGNOSIS — Z20822 Contact with and (suspected) exposure to covid-19: Secondary | ICD-10-CM

## 2020-03-08 LAB — SPECIMEN STATUS REPORT

## 2020-03-08 LAB — NOVEL CORONAVIRUS, NAA: SARS-CoV-2, NAA: NOT DETECTED

## 2020-03-08 LAB — SARS-COV-2, NAA 2 DAY TAT

## 2020-03-15 ENCOUNTER — Other Ambulatory Visit: Payer: Self-pay

## 2020-03-15 ENCOUNTER — Ambulatory Visit (INDEPENDENT_AMBULATORY_CARE_PROVIDER_SITE_OTHER): Payer: Managed Care, Other (non HMO)

## 2020-03-15 DIAGNOSIS — Z23 Encounter for immunization: Secondary | ICD-10-CM

## 2020-04-11 ENCOUNTER — Other Ambulatory Visit: Payer: Self-pay

## 2020-04-11 ENCOUNTER — Telehealth (INDEPENDENT_AMBULATORY_CARE_PROVIDER_SITE_OTHER): Payer: 59 | Admitting: Psychiatry

## 2020-04-11 DIAGNOSIS — F330 Major depressive disorder, recurrent, mild: Secondary | ICD-10-CM

## 2020-04-11 DIAGNOSIS — F410 Panic disorder [episodic paroxysmal anxiety] without agoraphobia: Secondary | ICD-10-CM

## 2020-04-11 DIAGNOSIS — F411 Generalized anxiety disorder: Secondary | ICD-10-CM | POA: Diagnosis not present

## 2020-04-11 MED ORDER — ALPRAZOLAM 0.5 MG PO TABS: 0.5000 mg | ORAL_TABLET | Freq: Two times a day (BID) | ORAL | 3 refills | Status: DC | PRN

## 2020-04-11 MED ORDER — MIRTAZAPINE 30 MG PO TABS: 30.0000 mg | ORAL_TABLET | Freq: Every day | ORAL | 5 refills | Status: DC
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
## 2020-04-28 ENCOUNTER — Emergency Department (HOSPITAL_COMMUNITY)
Admission: EM | Admit: 2020-04-28 | Discharge: 2020-04-28 | Disposition: A | Discharge status: Home / Self Care | Payer: Managed Care, Other (non HMO) | Attending: Emergency Medicine | Admitting: Emergency Medicine

## 2020-04-28 ENCOUNTER — Other Ambulatory Visit: Payer: Self-pay

## 2020-04-28 ENCOUNTER — Emergency Department (HOSPITAL_COMMUNITY): Payer: Managed Care, Other (non HMO)

## 2020-04-28 ENCOUNTER — Encounter (HOSPITAL_COMMUNITY): Payer: Self-pay | Admitting: Emergency Medicine

## 2020-04-28 DIAGNOSIS — S01312A Laceration without foreign body of left ear, initial encounter: Secondary | ICD-10-CM | POA: Diagnosis not present

## 2020-04-28 DIAGNOSIS — S0993XA Unspecified injury of face, initial encounter: Secondary | ICD-10-CM | POA: Diagnosis present

## 2020-04-28 DIAGNOSIS — S0181XA Laceration without foreign body of other part of head, initial encounter: Secondary | ICD-10-CM

## 2020-04-28 DIAGNOSIS — Z87891 Personal history of nicotine dependence: Secondary | ICD-10-CM | POA: Diagnosis not present

## 2020-04-28 DIAGNOSIS — Z23 Encounter for immunization: Secondary | ICD-10-CM | POA: Diagnosis not present

## 2020-04-28 DIAGNOSIS — S01412A Laceration without foreign body of left cheek and temporomandibular area, initial encounter: Secondary | ICD-10-CM | POA: Diagnosis not present

## 2020-04-28 HISTORY — DX: Depression, unspecified: F32.A

## 2020-04-28 HISTORY — DX: Anxiety disorder, unspecified: F41.9

## 2020-04-28 MED ORDER — TETANUS-DIPHTH-ACELL PERTUSSIS 5-2.5-18.5 LF-MCG/0.5 IM SUSY
0.5000 mL | PREFILLED_SYRINGE | Freq: Once | INTRAMUSCULAR | Status: AC
  Administered 2020-04-28: 0.5 mL via INTRAMUSCULAR
  Filled 2020-04-28: qty 0.5

## 2020-04-28 MED ORDER — ORPHENADRINE CITRATE ER 100 MG PO TB12: 100.0000 mg | ORAL_TABLET | Freq: Two times a day (BID) | ORAL | 0 refills | Status: AC | PRN

## 2020-04-28 MED ORDER — NAPROXEN 250 MG PO TABS: 250.0000 mg | ORAL_TABLET | Freq: Two times a day (BID) | ORAL | 0 refills | Status: AC

## 2020-04-28 MED ORDER — LIDOCAINE-EPINEPHRINE (PF) 2 %-1:200000 IJ SOLN: 10.0000 mL | Freq: Once | INTRAMUSCULAR | Status: AC

## 2020-04-28 MED ORDER — HYDROCODONE-ACETAMINOPHEN 5-325 MG PO TABS
1.0000 | ORAL_TABLET | Freq: Once | ORAL | Status: AC
  Administered 2020-04-28: 1 via ORAL
  Filled 2020-04-28: qty 1

## 2020-04-28 MED ORDER — LIDOCAINE-EPINEPHRINE (PF) 2 %-1:200000 IJ SOLN
INTRAMUSCULAR | Status: AC
  Administered 2020-04-28: 10 mL
  Filled 2020-04-28: qty 20

## 2020-04-28 MED ORDER — HYDROCODONE-ACETAMINOPHEN 5-325 MG PO TABS: 1.0000 | ORAL_TABLET | ORAL | 0 refills | Status: AC | PRN
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
## 2020-04-28 NOTE — ED Provider Notes (Signed)
7:45 AM-checkout from Dr. Lynelle Doctor to evaluate patient after imaging.  Case discussed with radiologist, who interpreted the head and cervical spine images.  No intracranial injury or cervical spine injury.  There was a small pneumothorax, right apical, on the CT cervical spine images.  Chest x-ray ordered to follow-up on abnormal CT cervical spine.  9:00 AM, -patient now complains of left lower leg pain, on exam no deformities or focal tenderness of left lower leg.  She states that this discomfort started after she stood up for the chest x-ray.  9:30 AM-chest x-ray remarkable only for right apical pneumothorax without fracture, or other concerning intrathoracic abnormalities.  Findings discussed with the patient and all questions were answered. Norco started in the emergency department.  We will send prescription for Norco to her pharmacy, advised her to rest.  She is currently on leave because of her other medical conditions.  She will be referred to PCP for follow-up care as needed.   Mancel Bale, MD 04/28/20 [phone]

## 2020-04-28 NOTE — Discharge Instructions (Addendum)
Ice packs to the injured or sore muscles for the next several days then start using heat. Take the medications for pain and muscle spasms. Return to the ED for any problems listed on the head injury sheet. Recheck if you aren't improving in the next week.  The sutures on your face need to be removed in 3 to 5 days, the ones in your ear in 5 to 7 days.  Recheck sooner for any signs of infection such as increased redness, swelling, pain, drainage of pus.  Be careful about the narcotic pain medicine, hydrocodone.  This can give additive sedation if you are also using the Xanax, and Norflex.  Do not drive when using this medicine.
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
## 2020-05-05 ENCOUNTER — Ambulatory Visit
Admission: EM | Admit: 2020-05-05 | Discharge: 2020-05-05 | Disposition: A | Discharge status: Home / Self Care | Payer: Managed Care, Other (non HMO)

## 2020-05-05 NOTE — ED Triage Notes (Signed)
Pt here for suture removal

## 2020-05-13 ENCOUNTER — Other Ambulatory Visit: Payer: Self-pay

## 2020-05-13 ENCOUNTER — Telehealth (INDEPENDENT_AMBULATORY_CARE_PROVIDER_SITE_OTHER): Payer: 59 | Admitting: Psychiatry

## 2020-05-13 DIAGNOSIS — F411 Generalized anxiety disorder: Secondary | ICD-10-CM

## 2020-05-13 DIAGNOSIS — F410 Panic disorder [episodic paroxysmal anxiety] without agoraphobia: Secondary | ICD-10-CM | POA: Diagnosis not present

## 2020-05-13 DIAGNOSIS — F330 Major depressive disorder, recurrent, mild: Secondary | ICD-10-CM | POA: Diagnosis not present

## 2020-05-13 MED ORDER — MIRTAZAPINE 7.5 MG PO TABS: 7.5000 mg | ORAL_TABLET | Freq: Every day | ORAL | 2 refills | Status: DC

## 2020-05-13 MED ORDER — DULOXETINE HCL 30 MG PO CPEP: ORAL_CAPSULE | ORAL | 0 refills | Status: DC
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
## 2020-05-30 ENCOUNTER — Ambulatory Visit
Admission: EM | Admit: 2020-05-30 | Discharge: 2020-05-30 | Disposition: A | Discharge status: Home / Self Care | Payer: Managed Care, Other (non HMO) | Attending: Emergency Medicine | Admitting: Emergency Medicine

## 2020-05-30 ENCOUNTER — Encounter: Payer: Self-pay | Admitting: Emergency Medicine

## 2020-05-30 ENCOUNTER — Other Ambulatory Visit: Payer: Self-pay

## 2020-05-30 DIAGNOSIS — N898 Other specified noninflammatory disorders of vagina: Secondary | ICD-10-CM | POA: Insufficient documentation

## 2020-05-30 DIAGNOSIS — Z202 Contact with and (suspected) exposure to infections with a predominantly sexual mode of transmission: Secondary | ICD-10-CM | POA: Diagnosis not present

## 2020-05-30 MED ORDER — CEFTRIAXONE SODIUM 500 MG IJ SOLR
500.0000 mg | Freq: Once | INTRAMUSCULAR | Status: AC
  Administered 2020-05-30: 13:00:00 500 mg via INTRAMUSCULAR

## 2020-05-30 MED ORDER — AZITHROMYCIN 500 MG PO TABS
1000.0000 mg | ORAL_TABLET | Freq: Once | ORAL | Status: AC
  Administered 2020-05-30: 13:00:00 1000 mg via ORAL

## 2020-05-30 NOTE — ED Provider Notes (Addendum)
[facility]   [id number] 05/30/20 Arrival Time: 1112   CC: STD  SUBJECTIVE:  [name] is a 34 y.o. female who presented to the urgent care with a complaint that she has been exposed to his STD recently.  Partner tested positive for chlamydia and gonorrhea.  She is reporting vaginal irritation and itching started yesterday.  Patient is sexually active with 1 female partners.  Denies vaginal discharge this time.  Has not tried any OTC medication.  S reports similar symptoms in the past and was diagnosed with gonorrhea and chlamydia.  She denies fever, chills, nausea, vomiting, abdominal or pelvic pain, urinary symptoms,  vaginal odor, vaginal bleeding, dyspareunia, vaginal rashes or lesions.   Patient's last menstrual period was 05/04/2020. Current birth control method: Compliant with BC:  ROS: As per HPI.  All other pertinent ROS negative.     Past Medical History:  Diagnosis Date  . Anxiety   . Cerebral aneurysm   . Depression   . SAH (subarachnoid hemorrhage) (HCC) 10/2018   Past Surgical History:  Procedure Laterality Date  . BLADDER REPAIR W/ CESAREAN SECTION  12/05/2007  . CRANIOTOMY  10/2018  . TUBAL LIGATION  12/05/2007   Allergies  Allergen Reactions  . Tramadol Hcl     REACTION: dizzy  . Sulfa Antibiotics Palpitations    Headache, neck stiffness, reported also   No current facility-administered medications on file prior to encounter.   Current Outpatient Medications on File Prior to Encounter  Medication Sig Dispense Refill  . ALPRAZolam (XANAX) 0.5 MG tablet Take 1 tablet (0.5 mg total) by mouth 2 (two) times daily as needed for anxiety. 60 tablet 3  . DULoxetine (CYMBALTA) 30 MG capsule Take 1 capsule (30 mg total) by mouth daily for 7 days, THEN 2 capsules (60 mg total) daily. 67 capsule 0  . HYDROcodone-acetaminophen (NORCO) 5-325 MG tablet Take 1 tablet by mouth every 4 (four) hours as needed for moderate pain or severe pain. 20 tablet 0  .  mirtazapine (REMERON) 7.5 MG tablet Take 1 tablet (7.5 mg total) by mouth at bedtime. 30 tablet 2  . Multiple Vitamins-Minerals (MULTIVITAMIN WOMEN PO) Take by mouth.    . naproxen (NAPROSYN) 250 MG tablet Take 1 tablet (250 mg total) by mouth 2 (two) times daily with a meal. 20 tablet 0  . orphenadrine (NORFLEX) 100 MG tablet Take 1 tablet (100 mg total) by mouth 2 (two) times daily as needed for muscle spasms or mild pain. 20 tablet 0    Social History   Socioeconomic History  . Marital status: Married    Spouse name: Not on file  . Number of children: 3  . Years of education: Not on file  . Highest education level: Not on file  Occupational History  . Occupation: Consulting civil engineer   Tobacco Use  . Smoking status: Former Games developer  . Smokeless tobacco: Never Used  Vaping Use  . Vaping Use: Never used  Substance and Sexual Activity  . Alcohol use: Yes    Alcohol/week: 3.0 standard drinks    Types: 3 Glasses of wine per week    Comment: whiskey on weekends  earlier  . Drug use: Not Currently    Types: Marijuana  . Sexual activity: Yes    Birth control/protection: Surgical  Other Topics Concern  . Not on file  Social History Narrative  . Not on file   Social Determinants of Health   Financial Resource Strain: Not on file  Food Insecurity: Not  on file  Transportation Needs: Not on file  Physical Activity: Not on file  Stress: Not on file  Social Connections: Not on file  Intimate Partner Violence: Not on file   Family History  Problem Relation Age of Onset  . Hypertension Mother   . Stroke Father   . Diabetes Father   . Alcohol abuse Father   . Alcohol abuse Maternal Grandfather     OBJECTIVE:  Vitals:   05/30/20 1224 05/30/20 1226  BP:  110/73  Pulse:  87  Resp:  17  Temp:  99.4 F (37.4 C)  TempSrc:  Oral  SpO2:  98%  Weight: 110 lb (49.9 kg)   Height: 5\' 2"  (1.575 m)      General appearance: Alert, NAD, appears stated age Head: NCAT Throat: lips, mucosa, and  tongue normal; teeth and gums normal Lungs: CTA bilaterally without adventitious breath sounds Heart: regular rate and rhythm.  Radial pulses 2+ symmetrical bilaterally Back: no CVA tenderness Abdomen: soft, non-tender; bowel sounds normal; no masses or organomegaly; no guarding or rebound tenderness GU: declines OR External examination without vulvar lesions or erythema Bimanual exam: Negative for cervical motion or adenexal tenderness; Speculum exam: Thick white/yellow discharge during pelvic exam.  Cervix visualized without erythema or lesions. Cervical swab obtained Skin: warm and dry Psychological:  Alert and cooperative. Normal mood and affect.  LABS:  Results for orders placed or performed in visit on 03/07/20  Novel Coronavirus, NAA ([facility])   Specimen: Nasopharyngeal(NP) swabs in vial transport medium   Nasopharynge  Screenin  Result Value Ref Range   SARS-CoV-2, NAA Not Detected Not Detected  SARS-COV-2, NAA 2 DAY TAT   Nasopharynge  Screenin  Result Value Ref Range   SARS-CoV-2, NAA 2 DAY TAT Performed   Specimen status report  Result Value Ref Range   specimen status report Comment     Labs Reviewed  CERVICOVAGINAL ANCILLARY ONLY    ASSESSMENT & PLAN:  1. Chlamydia contact, treated   2. Gonorrhea contact, treated   3. STD exposure   4. Vaginal irritation     Meds ordered this encounter  Medications  . azithromycin (ZITHROMAX) tablet 1,000 mg  . cefTRIAXone (ROCEPHIN) injection 500 mg    Pending: Labs Reviewed  CERVICOVAGINAL ANCILLARY ONLY   Patient is stable at discharge.  She has been exposed to chlamydia and gonorrhea.  Azithromycin 1 g oral and Rocephin 500 mg IM will be given respectively for chlamydia and gonorrhea.  Discharge instructions  Vaginal self-swab obtained.  We will follow up with you regarding abnormal results Given rocephin 500 mg injection for gonorrhea and azithromycin 1g in office for chlamydia Take medications as prescribed and  to completion If tests results are positive, please abstain from sexual activity until you and your partner(s) have been treated Follow up with PCP or [facility] if symptoms persists Return here or go to ER if you have any new or worsening symptoms fever, chills, nausea, vomiting, abdominal or pelvic pain, painful intercourse, vaginal discharge, vaginal bleeding, persistent symptoms despite treatment, etc...  Reviewed expectations re: course of current medical issues. Questions answered. Outlined signs and symptoms indicating need for more acute intervention. Patient verbalized understanding. After Visit Summary given.       03/09/20, FNP 05/30/20 1254    06/01/20, FNP 05/30/20 1254

## 2020-05-30 NOTE — ED Triage Notes (Signed)
Exposed to std recently, thinks its gonorrhea and chlamydia Reports vaginal irritation, discharge and itching since yesterday.

## 2020-05-30 NOTE — Discharge Instructions (Addendum)
Vaginal self-swab obtained.  We will follow up with you regarding abnormal results Given rocephin 500 mg injection for gonorrhea and azithromycin 1g in office for chlamydia Take medications as prescribed and to completion If tests results are positive, please abstain from sexual activity until you and your partner(s) have been treated Follow up with PCP or [facility] if symptoms persists Return here or go to ER if you have any new or worsening symptoms fever, chills, nausea, vomiting, abdominal or pelvic pain, painful intercourse, vaginal discharge, vaginal bleeding, persistent symptoms despite treatment, etc..Marland Kitchen

## 2020-06-03 ENCOUNTER — Telehealth (HOSPITAL_COMMUNITY): Payer: Self-pay | Admitting: Emergency Medicine

## 2020-06-03 LAB — CERVICOVAGINAL ANCILLARY ONLY
Bacterial Vaginitis (gardnerella): POSITIVE — AB
Candida Glabrata: NEGATIVE
Candida Vaginitis: NEGATIVE
Chlamydia: NEGATIVE
Comment: NEGATIVE
Comment: NEGATIVE
Comment: NEGATIVE
Comment: NEGATIVE
Comment: NEGATIVE
Comment: NORMAL
Neisseria Gonorrhea: NEGATIVE
Trichomonas: NEGATIVE

## 2020-06-03 MED ORDER — METRONIDAZOLE 500 MG PO TABS: 500.0000 mg | ORAL_TABLET | Freq: Two times a day (BID) | ORAL | 0 refills | Status: AC

## 2020-06-07 ENCOUNTER — Other Ambulatory Visit (HOSPITAL_COMMUNITY): Payer: Self-pay | Admitting: Psychiatry

## 2020-06-12 ENCOUNTER — Telehealth (INDEPENDENT_AMBULATORY_CARE_PROVIDER_SITE_OTHER): Payer: 59 | Admitting: Psychiatry

## 2020-06-12 ENCOUNTER — Other Ambulatory Visit: Payer: Self-pay

## 2020-06-12 DIAGNOSIS — F410 Panic disorder [episodic paroxysmal anxiety] without agoraphobia: Secondary | ICD-10-CM

## 2020-06-12 DIAGNOSIS — F330 Major depressive disorder, recurrent, mild: Secondary | ICD-10-CM | POA: Diagnosis not present

## 2020-06-12 DIAGNOSIS — F411 Generalized anxiety disorder: Secondary | ICD-10-CM | POA: Diagnosis not present

## 2020-06-12 MED ORDER — DULOXETINE HCL 30 MG PO CPEP: 30.0000 mg | ORAL_CAPSULE | Freq: Two times a day (BID) | ORAL | 0 refills | Status: DC

## 2020-06-12 MED ORDER — MIRTAZAPINE 15 MG PO TABS: 15.0000 mg | ORAL_TABLET | Freq: Every day | ORAL | 1 refills | Status: DC
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
## 2020-07-15 ENCOUNTER — Other Ambulatory Visit (HOSPITAL_COMMUNITY): Payer: Self-pay | Admitting: Psychiatry

## 2020-07-25 ENCOUNTER — Other Ambulatory Visit: Payer: Self-pay

## 2020-07-25 ENCOUNTER — Telehealth (INDEPENDENT_AMBULATORY_CARE_PROVIDER_SITE_OTHER): Payer: 59 | Admitting: Psychiatry

## 2020-07-25 DIAGNOSIS — F330 Major depressive disorder, recurrent, mild: Secondary | ICD-10-CM | POA: Diagnosis not present

## 2020-07-25 DIAGNOSIS — F411 Generalized anxiety disorder: Secondary | ICD-10-CM | POA: Diagnosis not present

## 2020-07-25 DIAGNOSIS — F410 Panic disorder [episodic paroxysmal anxiety] without agoraphobia: Secondary | ICD-10-CM

## 2020-07-25 MED ORDER — ALPRAZOLAM 0.5 MG PO TABS: 0.5000 mg | ORAL_TABLET | Freq: Two times a day (BID) | ORAL | 2 refills | Status: AC | PRN

## 2020-07-25 MED ORDER — DULOXETINE HCL 60 MG PO CPEP: 60.0000 mg | ORAL_CAPSULE | Freq: Every day | ORAL | 1 refills | Status: AC
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
## 2020-08-01 ENCOUNTER — Encounter (HOSPITAL_COMMUNITY): Payer: Self-pay

## 2020-12-03 ENCOUNTER — Telehealth (HOSPITAL_COMMUNITY): Payer: Self-pay | Admitting: *Deleted

## 2020-12-03 NOTE — Telephone Encounter (Signed)
Pt understanding was that, due to the letter, she would get extra fill. I will let her know. Thanks.

## 2020-12-03 NOTE — Telephone Encounter (Signed)
Both prescriptions were written in February for 90 days with additional 1 more refill.  Patient should be okay until 2022. Patient has enough time to establish care with a new provider.

## 2020-12-03 NOTE — Telephone Encounter (Signed)
Former pt of Dr. Stanton Kidney called requesting last 30 day supply of Remeron 15 mg and Cymbalta 60 mg. Pt has recently received certified letter. Please review.
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
## 2023-07-13 ENCOUNTER — Other Ambulatory Visit: Payer: Self-pay | Admitting: Internal Medicine

## 2023-07-13 DIAGNOSIS — G43909 Migraine, unspecified, not intractable, without status migrainosus: Secondary | ICD-10-CM
